# Patient Record
Sex: Female | Born: 1965
Health system: Southern US, Community
[De-identification: ages and names within clinical notes are randomized; demographics above are authoritative.]

## PROBLEM LIST (undated history)

## (undated) DIAGNOSIS — I10 Essential (primary) hypertension: Secondary | ICD-10-CM

## (undated) DIAGNOSIS — R682 Dry mouth, unspecified: Secondary | ICD-10-CM

## (undated) DIAGNOSIS — H04123 Dry eye syndrome of bilateral lacrimal glands: Secondary | ICD-10-CM

## (undated) DIAGNOSIS — G43909 Migraine, unspecified, not intractable, without status migrainosus: Secondary | ICD-10-CM

## (undated) DIAGNOSIS — T7840XA Allergy, unspecified, initial encounter: Secondary | ICD-10-CM

## (undated) DIAGNOSIS — M797 Fibromyalgia: Secondary | ICD-10-CM

## (undated) DIAGNOSIS — K589 Irritable bowel syndrome without diarrhea: Secondary | ICD-10-CM

## (undated) DIAGNOSIS — R5383 Other fatigue: Secondary | ICD-10-CM

## (undated) DIAGNOSIS — M1990 Unspecified osteoarthritis, unspecified site: Secondary | ICD-10-CM

## (undated) DIAGNOSIS — R569 Unspecified convulsions: Secondary | ICD-10-CM

## (undated) HISTORY — PX: KNEE SURGERY: SHX244

## (undated) HISTORY — DX: Migraine, unspecified, not intractable, without status migrainosus: G43.909

## (undated) HISTORY — DX: Unspecified convulsions: R56.9

## (undated) HISTORY — PX: COLONOSCOPY: SHX174

## (undated) HISTORY — DX: Allergy, unspecified, initial encounter: T78.40XA

## (undated) HISTORY — DX: Irritable bowel syndrome, unspecified: K58.9

## (undated) HISTORY — PX: OTHER SURGICAL HISTORY: SHX169

## (undated) HISTORY — DX: Dry mouth, unspecified: R68.2

## (undated) HISTORY — DX: Dry eye syndrome of bilateral lacrimal glands: H04.123

## (undated) HISTORY — DX: Other fatigue: R53.83

## (undated) HISTORY — PX: ABDOMINAL HYSTERECTOMY: SHX81

## (undated) HISTORY — PX: SHOULDER SURGERY: SHX246

---

## 1999-05-03 ENCOUNTER — Emergency Department (HOSPITAL_COMMUNITY): Admission: EM | Admit: 1999-05-03 | Discharge: 1999-05-03 | Payer: Self-pay | Admitting: Emergency Medicine

## 1999-05-03 ENCOUNTER — Encounter: Payer: Self-pay | Admitting: Emergency Medicine

## 1999-07-18 ENCOUNTER — Other Ambulatory Visit: Admission: RE | Admit: 1999-07-18 | Discharge: 1999-07-18 | Payer: Self-pay | Admitting: Obstetrics and Gynecology

## 1999-08-01 ENCOUNTER — Other Ambulatory Visit: Admission: RE | Admit: 1999-08-01 | Discharge: 1999-08-01 | Payer: Self-pay | Admitting: Obstetrics and Gynecology

## 1999-08-01 ENCOUNTER — Encounter (INDEPENDENT_AMBULATORY_CARE_PROVIDER_SITE_OTHER): Payer: Self-pay | Admitting: Specialist

## 2000-11-07 ENCOUNTER — Other Ambulatory Visit: Admission: RE | Admit: 2000-11-07 | Discharge: 2000-11-07 | Payer: Self-pay | Admitting: Obstetrics and Gynecology

## 2001-01-18 ENCOUNTER — Encounter: Admission: RE | Admit: 2001-01-18 | Discharge: 2001-01-18 | Payer: Self-pay | Admitting: Orthopedic Surgery

## 2001-01-18 ENCOUNTER — Encounter: Payer: Self-pay | Admitting: Orthopedic Surgery

## 2001-12-26 ENCOUNTER — Other Ambulatory Visit: Admission: RE | Admit: 2001-12-26 | Discharge: 2001-12-26 | Payer: Self-pay | Admitting: Obstetrics and Gynecology

## 2002-06-09 ENCOUNTER — Encounter: Payer: Self-pay | Admitting: Family Medicine

## 2002-06-09 ENCOUNTER — Encounter: Admission: RE | Admit: 2002-06-09 | Discharge: 2002-06-09 | Payer: Self-pay | Admitting: Family Medicine

## 2003-03-03 ENCOUNTER — Other Ambulatory Visit: Admission: RE | Admit: 2003-03-03 | Discharge: 2003-03-03 | Payer: Self-pay | Admitting: Obstetrics and Gynecology

## 2003-08-10 ENCOUNTER — Encounter: Admission: RE | Admit: 2003-08-10 | Discharge: 2003-08-10 | Payer: Self-pay | Admitting: Family Medicine

## 2003-08-10 ENCOUNTER — Encounter: Payer: Self-pay | Admitting: Family Medicine

## 2004-05-16 ENCOUNTER — Other Ambulatory Visit: Admission: RE | Admit: 2004-05-16 | Discharge: 2004-05-16 | Payer: Self-pay | Admitting: Obstetrics and Gynecology

## 2004-11-18 ENCOUNTER — Encounter: Admission: RE | Admit: 2004-11-18 | Discharge: 2004-11-18 | Payer: Self-pay | Admitting: Family Medicine

## 2005-06-02 ENCOUNTER — Other Ambulatory Visit: Admission: RE | Admit: 2005-06-02 | Discharge: 2005-06-02 | Payer: Self-pay | Admitting: Obstetrics and Gynecology

## 2005-06-23 ENCOUNTER — Encounter: Admission: RE | Admit: 2005-06-23 | Discharge: 2005-06-23 | Payer: Self-pay | Admitting: Gastroenterology

## 2005-12-15 ENCOUNTER — Ambulatory Visit (HOSPITAL_COMMUNITY): Admission: RE | Admit: 2005-12-15 | Discharge: 2005-12-15 | Payer: Self-pay | Admitting: Gastroenterology

## 2005-12-18 ENCOUNTER — Ambulatory Visit (HOSPITAL_COMMUNITY): Admission: RE | Admit: 2005-12-18 | Discharge: 2005-12-18 | Payer: Self-pay | Admitting: Gastroenterology

## 2007-01-24 ENCOUNTER — Encounter: Admission: RE | Admit: 2007-01-24 | Discharge: 2007-01-24 | Payer: Self-pay | Admitting: Internal Medicine

## 2008-04-16 ENCOUNTER — Encounter: Admission: RE | Admit: 2008-04-16 | Discharge: 2008-04-16 | Payer: Self-pay | Admitting: Obstetrics and Gynecology

## 2010-02-04 ENCOUNTER — Encounter: Admission: RE | Admit: 2010-02-04 | Discharge: 2010-02-04 | Payer: Self-pay | Admitting: Orthopedic Surgery

## 2010-04-01 ENCOUNTER — Ambulatory Visit: Admission: RE | Admit: 2010-04-01 | Discharge: 2010-04-01 | Payer: Self-pay | Admitting: Orthopaedic Surgery

## 2010-04-01 ENCOUNTER — Ambulatory Visit: Payer: Self-pay | Admitting: Vascular Surgery

## 2011-10-04 ENCOUNTER — Other Ambulatory Visit: Payer: Self-pay | Admitting: Obstetrics and Gynecology

## 2011-10-04 DIAGNOSIS — R928 Other abnormal and inconclusive findings on diagnostic imaging of breast: Secondary | ICD-10-CM

## 2011-10-10 ENCOUNTER — Ambulatory Visit
Admission: RE | Admit: 2011-10-10 | Discharge: 2011-10-10 | Disposition: A | Discharge status: Home / Self Care | Payer: BC Managed Care – PPO | Source: Ambulatory Visit | Attending: Obstetrics and Gynecology | Admitting: Obstetrics and Gynecology

## 2011-10-10 DIAGNOSIS — R928 Other abnormal and inconclusive findings on diagnostic imaging of breast: Secondary | ICD-10-CM

## 2012-04-18 ENCOUNTER — Other Ambulatory Visit: Payer: Self-pay | Admitting: Orthopedic Surgery

## 2012-04-18 DIAGNOSIS — M25569 Pain in unspecified knee: Secondary | ICD-10-CM

## 2012-04-25 ENCOUNTER — Ambulatory Visit
Admission: RE | Admit: 2012-04-25 | Discharge: 2012-04-25 | Disposition: A | Discharge status: Home / Self Care | Payer: BC Managed Care – PPO | Source: Ambulatory Visit | Attending: Orthopedic Surgery | Admitting: Orthopedic Surgery

## 2012-04-25 DIAGNOSIS — M25569 Pain in unspecified knee: Secondary | ICD-10-CM

## 2012-08-20 ENCOUNTER — Other Ambulatory Visit: Payer: Self-pay | Admitting: Obstetrics and Gynecology

## 2012-08-20 DIAGNOSIS — Z1231 Encounter for screening mammogram for malignant neoplasm of breast: Secondary | ICD-10-CM

## 2012-09-30 ENCOUNTER — Ambulatory Visit: Payer: BC Managed Care – PPO

## 2012-09-30 ENCOUNTER — Ambulatory Visit
Admission: RE | Admit: 2012-09-30 | Discharge: 2012-09-30 | Disposition: A | Discharge status: Home / Self Care | Payer: BC Managed Care – PPO | Source: Ambulatory Visit | Attending: Obstetrics and Gynecology | Admitting: Obstetrics and Gynecology

## 2012-09-30 DIAGNOSIS — Z1231 Encounter for screening mammogram for malignant neoplasm of breast: Secondary | ICD-10-CM

## 2013-11-07 ENCOUNTER — Other Ambulatory Visit: Payer: Self-pay

## 2013-11-07 DIAGNOSIS — Z1231 Encounter for screening mammogram for malignant neoplasm of breast: Secondary | ICD-10-CM

## 2013-11-10 ENCOUNTER — Ambulatory Visit
Admission: RE | Admit: 2013-11-10 | Discharge: 2013-11-10 | Disposition: A | Discharge status: Home / Self Care | Payer: No Typology Code available for payment source | Source: Ambulatory Visit

## 2013-11-10 DIAGNOSIS — Z1231 Encounter for screening mammogram for malignant neoplasm of breast: Secondary | ICD-10-CM

## 2014-11-30 ENCOUNTER — Other Ambulatory Visit: Payer: Self-pay

## 2014-11-30 DIAGNOSIS — Z1231 Encounter for screening mammogram for malignant neoplasm of breast: Secondary | ICD-10-CM

## 2014-12-04 ENCOUNTER — Ambulatory Visit
Admission: RE | Admit: 2014-12-04 | Discharge: 2014-12-04 | Disposition: A | Discharge status: Home / Self Care | Payer: No Typology Code available for payment source | Source: Ambulatory Visit

## 2014-12-04 DIAGNOSIS — Z1231 Encounter for screening mammogram for malignant neoplasm of breast: Secondary | ICD-10-CM

## 2015-07-07 ENCOUNTER — Encounter (HOSPITAL_COMMUNITY): Payer: Self-pay | Admitting: *Deleted

## 2015-07-07 ENCOUNTER — Emergency Department (INDEPENDENT_AMBULATORY_CARE_PROVIDER_SITE_OTHER)
Admission: EM | Admit: 2015-07-07 | Discharge: 2015-07-07 | Disposition: A | Discharge status: Home / Self Care | Payer: Worker's Compensation | Source: Home / Self Care | Attending: Family Medicine | Admitting: Family Medicine

## 2015-07-07 ENCOUNTER — Emergency Department (INDEPENDENT_AMBULATORY_CARE_PROVIDER_SITE_OTHER): Payer: Worker's Compensation

## 2015-07-07 DIAGNOSIS — S93402A Sprain of unspecified ligament of left ankle, initial encounter: Secondary | ICD-10-CM

## 2015-07-07 HISTORY — DX: Essential (primary) hypertension: I10

## 2015-07-07 HISTORY — DX: Fibromyalgia: M79.7

## 2015-07-07 HISTORY — DX: Unspecified osteoarthritis, unspecified site: M19.90

## 2015-07-07 NOTE — ED Notes (Signed)
pT  REPORTS  SHE  WAS  AT  WORK  AND  STEPPED  IN A  HOLE  /  TWISTING  HER L  ANKLE          dENYS  ANY OTHER INJURYS  SHE  IS  ABLE  TO  AMBULATE

## 2015-07-07 NOTE — ED Provider Notes (Signed)
CSN: [id number]     Arrival date & time 07/07/15  1703 History   None    Chief Complaint  Patient presents with  . Ankle Injury   (Consider location/radiation/quality/duration/timing/severity/associated sxs/prior Treatment) Patient is a 49 y.o. female presenting with lower extremity injury. The history is provided by the patient.  Ankle Injury This is a new problem. 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 The problem has not changed since onset.   Past Medical History  Diagnosis Date  . Hypertension   . Fibromyalgia   . Arthritis    Past Surgical History  Procedure Laterality Date  . Abdominal hysterectomy    . Shoulder surgery    . Knee surgery     History reviewed. No pertinent family history. Social History  Substance Use Topics  . Smoking status: Never Smoker   . Smokeless tobacco: None  . Alcohol Use: No   OB History    No data available     Review of Systems  Constitutional: Negative.   Musculoskeletal: Positive for joint swelling. Negative for myalgias and gait problem.  Skin: Negative.   All other systems reviewed and are negative.   Allergies  Codeine and Sulfa antibiotics  Home Medications   Prior to Admission medications   Medication Sig Start Date End Date Taking? Authorizing Provider  Amphetamine-Dextroamphetamine (ADDERALL PO) Take by mouth.   Yes Historical Provider, MD  DULoxetine HCl (CYMBALTA PO) Take by mouth.   Yes Historical Provider, MD  ESTRADIOL ACETATE PO Take by mouth.   Yes Historical Provider, MD  Hydroxychloroquine Sulfate (PLAQUENIL PO) Take by mouth.   Yes Historical Provider, MD  Ibuprofen (IBU PO) Take by mouth.   Yes Historical Provider, MD  losartan-hydrochlorothiazide (HYZAAR) 100-25 MG per tablet Take 1 tablet by mouth daily.   Yes Historical Provider, MD   Meds Ordered and Administered this Visit  Medications - No data to display  BP 135/93 mmHg  Pulse  100  Temp(Src) 99.2 F (37.3 C) (Oral)  Resp 18  SpO2 97% No data found.   Physical Exam  Constitutional: She is oriented to person, place, and time. She appears well-developed and well-nourished.  Musculoskeletal: She exhibits tenderness.       Left ankle: She exhibits swelling. She exhibits no ecchymosis, no deformity and normal pulse. Tenderness. Lateral malleolus tenderness found. No medial malleolus, no CF ligament, no posterior TFL, no head of 5th metatarsal and no proximal fibula tenderness found. Achilles tendon normal.  Neurological: She is alert and oriented to person, place, and time.  Skin: Skin is warm and dry.  Nursing note and vitals reviewed.   ED Course  Procedures (including critical care time)  Labs Review Labs Reviewed - No data to display  Imaging Review Dg Ankle Complete Left  07/07/2015   CLINICAL DATA:  Status post ankle injury today with left ankle pain.  EXAM: LEFT ANKLE COMPLETE - 3+ VIEW  COMPARISON:  None.  FINDINGS: There is no evidence of fracture, dislocation, or joint effusion. There is no evidence of arthropathy or other focal bone abnormality. Soft tissues are unremarkable.  IMPRESSION: Negative.   Electronically Signed   By: Abelardo Diesel M.D.   On: 07/07/2015 18:33   X-rays reviewed and report per radiologist.   Visual Acuity Review  Right Eye Distance:   Left Eye Distance:   Bilateral Distance:    Right Eye Near:   Left Eye  Near:    Bilateral Near:         MDM   1. Ankle sprain, left, initial encounter        Billy Fischer, MD 07/07/15 2102

## 2015-07-07 NOTE — Discharge Instructions (Signed)
Wear ankle support as needed for comfort, activity as tolerated. advil and ice as needed, return or see orthopedist if further problems. °

## 2015-12-29 ENCOUNTER — Other Ambulatory Visit: Payer: Self-pay

## 2015-12-29 DIAGNOSIS — Z1231 Encounter for screening mammogram for malignant neoplasm of breast: Secondary | ICD-10-CM

## 2016-01-27 ENCOUNTER — Ambulatory Visit
Admission: RE | Admit: 2016-01-27 | Discharge: 2016-01-27 | Disposition: A | Discharge status: Home / Self Care | Payer: Managed Care, Other (non HMO) | Source: Ambulatory Visit

## 2016-01-27 DIAGNOSIS — Z1231 Encounter for screening mammogram for malignant neoplasm of breast: Secondary | ICD-10-CM

## 2016-11-08 DIAGNOSIS — J0100 Acute maxillary sinusitis, unspecified: Secondary | ICD-10-CM | POA: Diagnosis not present

## 2016-11-08 DIAGNOSIS — R05 Cough: Secondary | ICD-10-CM | POA: Diagnosis not present

## 2016-11-08 MED FILL — PROMETHAZINE-DM SYRUP: 6.25-15 | 3 days supply | Qty: 120 | Fill #0

## 2016-11-08 MED FILL — AMOX-CLAV 875-125 MG TABLET: 875-125 | 10 days supply | Qty: 20 | Fill #0

## 2016-11-24 DIAGNOSIS — F900 Attention-deficit hyperactivity disorder, predominantly inattentive type: Secondary | ICD-10-CM | POA: Diagnosis not present

## 2016-11-24 DIAGNOSIS — M797 Fibromyalgia: Secondary | ICD-10-CM | POA: Diagnosis not present

## 2016-11-24 DIAGNOSIS — J0101 Acute recurrent maxillary sinusitis: Secondary | ICD-10-CM | POA: Diagnosis not present

## 2016-11-24 DIAGNOSIS — I10 Essential (primary) hypertension: Secondary | ICD-10-CM | POA: Diagnosis not present

## 2016-11-24 MED FILL — AZITHROMYCIN 250 MG TABLET: 250 | 5 days supply | Qty: 6 | Fill #0

## 2016-11-24 MED FILL — AMPHETAMINE SALTS 30 MG TAB: 30 | 30 days supply | Qty: 60 | Fill #0

## 2016-11-24 MED FILL — LOSARTAN-HCTZ 100-25 MG TAB: 100-25 | 90 days supply | Qty: 90 | Fill #0

## 2016-11-30 MED FILL — HYDROXYCHLOROQUINE 200 MG T: 200 | 30 days supply | Qty: 60 | Fill #0

## 2016-11-30 MED FILL — VENLAFAXINE HCL ER 75 MG CA: 75 | 90 days supply | Qty: 90 | Fill #0

## 2016-11-30 MED FILL — ESTRADIOL 2 MG TABLET: 2 | 90 days supply | Qty: 90 | Fill #0

## 2016-12-19 MED FILL — levoFLOXacin 500 MG TABS: 500 | 10 days supply | Qty: 10 | Fill #0

## 2017-01-03 MED FILL — HYDROXYCHLOROQUINE 200 MG T: 200 | 30 days supply | Qty: 60 | Fill #1

## 2017-01-05 ENCOUNTER — Other Ambulatory Visit: Payer: Self-pay | Admitting: Obstetrics and Gynecology

## 2017-01-05 DIAGNOSIS — Z1231 Encounter for screening mammogram for malignant neoplasm of breast: Secondary | ICD-10-CM

## 2017-01-17 DIAGNOSIS — Z01419 Encounter for gynecological examination (general) (routine) without abnormal findings: Secondary | ICD-10-CM | POA: Diagnosis not present

## 2017-01-17 DIAGNOSIS — Z6829 Body mass index (BMI) 29.0-29.9, adult: Secondary | ICD-10-CM | POA: Diagnosis not present

## 2017-02-02 ENCOUNTER — Ambulatory Visit: Payer: Self-pay

## 2017-02-06 MED FILL — HYDROXYCHLOROQUINE 200 MG T: 200 | 30 days supply | Qty: 60 | Fill #2

## 2017-02-09 ENCOUNTER — Ambulatory Visit
Admission: RE | Admit: 2017-02-09 | Discharge: 2017-02-09 | Disposition: A | Discharge status: Home / Self Care | Payer: 59 | Source: Ambulatory Visit | Attending: Obstetrics and Gynecology | Admitting: Obstetrics and Gynecology

## 2017-02-09 DIAGNOSIS — Z1231 Encounter for screening mammogram for malignant neoplasm of breast: Secondary | ICD-10-CM | POA: Diagnosis not present

## 2017-02-22 DIAGNOSIS — M79642 Pain in left hand: Secondary | ICD-10-CM | POA: Diagnosis not present

## 2017-02-22 DIAGNOSIS — M25522 Pain in left elbow: Secondary | ICD-10-CM | POA: Diagnosis not present

## 2017-02-23 DIAGNOSIS — Z79899 Other long term (current) drug therapy: Secondary | ICD-10-CM | POA: Diagnosis not present

## 2017-02-28 MED FILL — LOSARTAN-HCTZ 100-25 MG TAB: 100-25 | 90 days supply | Qty: 90 | Fill #1

## 2017-02-28 MED FILL — VENLAFAXINE HCL ER 75 MG CA: 75 | 90 days supply | Qty: 90 | Fill #0

## 2017-02-28 MED FILL — ESTRADIOL 2 MG TABLET: 2 | 90 days supply | Qty: 90 | Fill #0

## 2017-03-01 MED FILL — IBUPROFEN 800 MG TAB: 800 | 90 days supply | Qty: 270 | Fill #0

## 2017-03-01 MED FILL — DULoxetine HCL 60 MG CPEP: 60 | 90 days supply | Qty: 90 | Fill #0

## 2017-03-01 MED FILL — AMPHETAMINE SALTS 30 MG TAB: 30 | 30 days supply | Qty: 60 | Fill #0

## 2017-03-02 DIAGNOSIS — H524 Presbyopia: Secondary | ICD-10-CM | POA: Diagnosis not present

## 2017-03-02 DIAGNOSIS — H40013 Open angle with borderline findings, low risk, bilateral: Secondary | ICD-10-CM | POA: Diagnosis not present

## 2017-03-02 DIAGNOSIS — Z79899 Other long term (current) drug therapy: Secondary | ICD-10-CM | POA: Diagnosis not present

## 2017-03-02 DIAGNOSIS — H5203 Hypermetropia, bilateral: Secondary | ICD-10-CM | POA: Diagnosis not present

## 2017-03-06 MED FILL — HYDROXYCHLOROQUINE 200 MG T: 200 | 30 days supply | Qty: 60 | Fill #0

## 2017-04-03 MED FILL — HYDROXYCHLOROQUINE 200 MG T: 200 | 30 days supply | Qty: 60 | Fill #1

## 2017-04-19 DIAGNOSIS — M25539 Pain in unspecified wrist: Secondary | ICD-10-CM | POA: Diagnosis not present

## 2017-04-19 DIAGNOSIS — M150 Primary generalized (osteo)arthritis: Secondary | ICD-10-CM | POA: Diagnosis not present

## 2017-04-19 DIAGNOSIS — Z79899 Other long term (current) drug therapy: Secondary | ICD-10-CM | POA: Diagnosis not present

## 2017-04-19 DIAGNOSIS — M797 Fibromyalgia: Secondary | ICD-10-CM | POA: Diagnosis not present

## 2017-04-23 MED FILL — FOLIC ACID 1 MG TABLET: 1 | 30 days supply | Qty: 30 | Fill #0

## 2017-04-23 MED FILL — METHOTREXATE 2.5 MG TABLET: 2.5 | 28 days supply | Qty: 16 | Fill #0

## 2017-05-01 MED FILL — HYDROXYCHLOROQUINE 200 MG T: 200 | 30 days supply | Qty: 60 | Fill #2

## 2017-05-11 DIAGNOSIS — M797 Fibromyalgia: Secondary | ICD-10-CM | POA: Diagnosis not present

## 2017-05-11 DIAGNOSIS — M064 Inflammatory polyarthropathy: Secondary | ICD-10-CM | POA: Diagnosis not present

## 2017-05-11 DIAGNOSIS — M150 Primary generalized (osteo)arthritis: Secondary | ICD-10-CM | POA: Diagnosis not present

## 2017-05-11 DIAGNOSIS — Z79899 Other long term (current) drug therapy: Secondary | ICD-10-CM | POA: Diagnosis not present

## 2017-05-25 DIAGNOSIS — F900 Attention-deficit hyperactivity disorder, predominantly inattentive type: Secondary | ICD-10-CM | POA: Diagnosis not present

## 2017-05-25 DIAGNOSIS — I10 Essential (primary) hypertension: Secondary | ICD-10-CM | POA: Diagnosis not present

## 2017-05-25 DIAGNOSIS — M797 Fibromyalgia: Secondary | ICD-10-CM | POA: Diagnosis not present

## 2017-05-25 DIAGNOSIS — R5383 Other fatigue: Secondary | ICD-10-CM | POA: Diagnosis not present

## 2017-05-25 MED FILL — METHOTREXATE 2.5 MG TABLET: 2.5 | 28 days supply | Qty: 24 | Fill #0

## 2017-05-25 MED FILL — AMPHETAMINE SALTS 30 MG TAB: 30 | 30 days supply | Qty: 60 | Fill #0

## 2017-05-25 MED FILL — FOLIC ACID 1 MG TABLET: 1 | 30 days supply | Qty: 30 | Fill #1

## 2017-05-25 MED FILL — LOSARTAN-HCTZ 100-25 MG TAB: 100-25 | 90 days supply | Qty: 90 | Fill #0

## 2017-05-25 MED FILL — HYDROXYCHLOROQUINE 200 MG T: 200 | 90 days supply | Qty: 180 | Fill #0

## 2017-05-28 MED FILL — DULoxetine HCL 60 MG CPEP: 60 | 90 days supply | Qty: 90 | Fill #0

## 2017-05-28 MED FILL — VENLAFAXINE HCL ER 75 MG CA: 75 | 90 days supply | Qty: 90 | Fill #1

## 2017-06-02 DIAGNOSIS — S06339A Contusion and laceration of cerebrum, unspecified, with loss of consciousness of unspecified duration, initial encounter: Secondary | ICD-10-CM | POA: Diagnosis not present

## 2017-06-05 MED FILL — ESTRADIOL 2 MG TABLET: 2 | 90 days supply | Qty: 90 | Fill #1

## 2017-06-28 DIAGNOSIS — M25561 Pain in right knee: Secondary | ICD-10-CM | POA: Diagnosis not present

## 2017-06-28 DIAGNOSIS — M25562 Pain in left knee: Secondary | ICD-10-CM | POA: Diagnosis not present

## 2017-07-05 MED FILL — METHOTREXATE 2.5 MG TABLET: 2.5 | 28 days supply | Qty: 24 | Fill #1

## 2017-07-05 MED FILL — FOLIC ACID 1 MG TABLET: 1 | 30 days supply | Qty: 30 | Fill #2

## 2017-07-06 MED FILL — AMPHETAMINE SALTS 30 MG TAB: 30 | 30 days supply | Qty: 60 | Fill #0

## 2017-07-12 DIAGNOSIS — Z79899 Other long term (current) drug therapy: Secondary | ICD-10-CM | POA: Diagnosis not present

## 2017-07-12 DIAGNOSIS — M150 Primary generalized (osteo)arthritis: Secondary | ICD-10-CM | POA: Diagnosis not present

## 2017-07-12 DIAGNOSIS — M3500 Sicca syndrome, unspecified: Secondary | ICD-10-CM | POA: Diagnosis not present

## 2017-07-12 DIAGNOSIS — M064 Inflammatory polyarthropathy: Secondary | ICD-10-CM | POA: Diagnosis not present

## 2017-07-13 DIAGNOSIS — D229 Melanocytic nevi, unspecified: Secondary | ICD-10-CM | POA: Diagnosis not present

## 2017-07-13 DIAGNOSIS — L4050 Arthropathic psoriasis, unspecified: Secondary | ICD-10-CM | POA: Diagnosis not present

## 2017-07-13 DIAGNOSIS — L409 Psoriasis, unspecified: Secondary | ICD-10-CM | POA: Diagnosis not present

## 2017-08-21 MED FILL — DULoxetine HCL 60 MG CPEP: 60 | 90 days supply | Qty: 90 | Fill #1

## 2017-08-21 MED FILL — HYDROXYCHLOROQUINE 200 MG T: 200 | 30 days supply | Qty: 60 | Fill #1

## 2017-08-21 MED FILL — VENLAFAXINE HCL ER 75 MG CA: 75 | 90 days supply | Qty: 90 | Fill #2

## 2017-08-21 MED FILL — LOSARTAN-HCTZ 100-25 MG TAB: 100-25 | 90 days supply | Qty: 90 | Fill #1

## 2017-08-21 MED FILL — IBUPROFEN 800 MG TABS: 800 | 90 days supply | Qty: 270 | Fill #0

## 2017-08-21 MED FILL — ESTRADIOL 2 MG TABLET: 2 | 90 days supply | Qty: 90 | Fill #2

## 2017-08-22 MED FILL — AMPHETAMINE SALTS 30 MG TAB: 30 | 30 days supply | Qty: 60 | Fill #0

## 2017-09-14 DIAGNOSIS — J0100 Acute maxillary sinusitis, unspecified: Secondary | ICD-10-CM | POA: Diagnosis not present

## 2017-09-25 MED FILL — HYDROXYCHLOROQUINE 200 MG T: 200 | 90 days supply | Qty: 180 | Fill #0

## 2017-09-26 MED FILL — METHYLPREDNISOLONE 4 MG TAB: 4 | 6 days supply | Qty: 21 | Fill #0

## 2017-10-15 DIAGNOSIS — G4489 Other headache syndrome: Secondary | ICD-10-CM | POA: Diagnosis not present

## 2017-10-16 ENCOUNTER — Other Ambulatory Visit (HOSPITAL_COMMUNITY): Payer: Self-pay | Admitting: Family Medicine

## 2017-10-16 DIAGNOSIS — G4489 Other headache syndrome: Secondary | ICD-10-CM

## 2017-10-19 ENCOUNTER — Encounter (HOSPITAL_COMMUNITY): Payer: Self-pay | Admitting: Radiology

## 2017-10-19 ENCOUNTER — Ambulatory Visit (HOSPITAL_COMMUNITY)
Admission: RE | Admit: 2017-10-19 | Discharge: 2017-10-19 | Disposition: A | Discharge status: Home / Self Care | Payer: 59 | Source: Ambulatory Visit | Attending: Family Medicine | Admitting: Family Medicine

## 2017-10-19 DIAGNOSIS — R51 Headache: Secondary | ICD-10-CM | POA: Diagnosis not present

## 2017-10-19 DIAGNOSIS — G4489 Other headache syndrome: Secondary | ICD-10-CM | POA: Diagnosis not present

## 2017-11-16 DIAGNOSIS — Z79899 Other long term (current) drug therapy: Secondary | ICD-10-CM | POA: Diagnosis not present

## 2017-11-16 DIAGNOSIS — M797 Fibromyalgia: Secondary | ICD-10-CM | POA: Diagnosis not present

## 2017-11-16 DIAGNOSIS — M3500 Sicca syndrome, unspecified: Secondary | ICD-10-CM | POA: Diagnosis not present

## 2017-11-16 DIAGNOSIS — M064 Inflammatory polyarthropathy: Secondary | ICD-10-CM | POA: Diagnosis not present

## 2017-11-16 DIAGNOSIS — M150 Primary generalized (osteo)arthritis: Secondary | ICD-10-CM | POA: Diagnosis not present

## 2017-11-16 DIAGNOSIS — M2550 Pain in unspecified joint: Secondary | ICD-10-CM | POA: Diagnosis not present

## 2017-11-16 MED FILL — CELECOXIB 200 MG CAP: 200 | 30 days supply | Qty: 30 | Fill #0

## 2017-11-16 MED FILL — METHYLPREDNISOLONE 4 MG TAB: 4 | 30 days supply | Qty: 30 | Fill #0

## 2017-11-16 MED FILL — LEFLUNOMIDE 10 MG TABLET: 10 | 30 days supply | Qty: 30 | Fill #0

## 2017-11-23 DIAGNOSIS — K219 Gastro-esophageal reflux disease without esophagitis: Secondary | ICD-10-CM | POA: Diagnosis not present

## 2017-11-23 DIAGNOSIS — I10 Essential (primary) hypertension: Secondary | ICD-10-CM | POA: Diagnosis not present

## 2017-11-23 DIAGNOSIS — F900 Attention-deficit hyperactivity disorder, predominantly inattentive type: Secondary | ICD-10-CM | POA: Diagnosis not present

## 2017-11-23 DIAGNOSIS — M797 Fibromyalgia: Secondary | ICD-10-CM | POA: Diagnosis not present

## 2017-11-23 DIAGNOSIS — Z1211 Encounter for screening for malignant neoplasm of colon: Secondary | ICD-10-CM | POA: Diagnosis not present

## 2017-11-23 DIAGNOSIS — K589 Irritable bowel syndrome without diarrhea: Secondary | ICD-10-CM | POA: Diagnosis not present

## 2017-11-26 MED FILL — VENLAFAXINE HCL ER 75 MG CA: 75 | 90 days supply | Qty: 90 | Fill #3

## 2017-11-26 MED FILL — ESTRADIOL 2 MG TABLET: 2 | 90 days supply | Qty: 90 | Fill #3

## 2017-11-27 MED FILL — AMPHETAMINE SALTS 30 MG TAB: 30 | 30 days supply | Qty: 60 | Fill #0

## 2017-11-27 MED FILL — LOSARTAN-HCTZ 100-25 MG TAB: 100-25 | 90 days supply | Qty: 90 | Fill #0

## 2017-11-30 MED FILL — DULoxetine HCL 60 MG CPEP: 60 | 90 days supply | Qty: 90 | Fill #0

## 2017-12-05 ENCOUNTER — Encounter: Payer: Self-pay | Admitting: Gastroenterology

## 2017-12-14 DIAGNOSIS — M797 Fibromyalgia: Secondary | ICD-10-CM | POA: Diagnosis not present

## 2017-12-14 DIAGNOSIS — M150 Primary generalized (osteo)arthritis: Secondary | ICD-10-CM | POA: Diagnosis not present

## 2017-12-14 DIAGNOSIS — M3500 Sicca syndrome, unspecified: Secondary | ICD-10-CM | POA: Diagnosis not present

## 2017-12-14 DIAGNOSIS — M2550 Pain in unspecified joint: Secondary | ICD-10-CM | POA: Diagnosis not present

## 2017-12-14 DIAGNOSIS — M064 Inflammatory polyarthropathy: Secondary | ICD-10-CM | POA: Diagnosis not present

## 2017-12-14 DIAGNOSIS — Z79899 Other long term (current) drug therapy: Secondary | ICD-10-CM | POA: Diagnosis not present

## 2017-12-14 MED FILL — METHYLPREDNISOLONE 4 MG TAB: 4 | 30 days supply | Qty: 30 | Fill #0

## 2017-12-14 MED FILL — LEFLUNOMIDE 20 MG TABLET: 20 | 30 days supply | Qty: 30 | Fill #0

## 2017-12-31 MED FILL — HYDROXYCHLOROQUINE 200 MG: 200 | 90 days supply | Qty: 180 | Fill #1

## 2018-01-02 MED FILL — AMPHETAMINE SALTS 30 MG TAB: 30 | 30 days supply | Qty: 60 | Fill #0

## 2018-01-04 DIAGNOSIS — R21 Rash and other nonspecific skin eruption: Secondary | ICD-10-CM | POA: Diagnosis not present

## 2018-01-10 ENCOUNTER — Other Ambulatory Visit: Payer: Self-pay | Admitting: Obstetrics and Gynecology

## 2018-01-10 DIAGNOSIS — Z139 Encounter for screening, unspecified: Secondary | ICD-10-CM

## 2018-01-16 MED FILL — LEFLUNOMIDE 20 MG TABLET: 20 | 30 days supply | Qty: 30 | Fill #1

## 2018-01-22 DIAGNOSIS — L509 Urticaria, unspecified: Secondary | ICD-10-CM | POA: Diagnosis not present

## 2018-01-22 DIAGNOSIS — L409 Psoriasis, unspecified: Secondary | ICD-10-CM | POA: Diagnosis not present

## 2018-02-01 ENCOUNTER — Encounter: Payer: Self-pay | Admitting: Gastroenterology

## 2018-02-01 ENCOUNTER — Other Ambulatory Visit: Payer: Self-pay

## 2018-02-01 ENCOUNTER — Ambulatory Visit (AMBULATORY_SURGERY_CENTER): Payer: Self-pay

## 2018-02-01 VITALS — Ht 61.75 in | Wt 153.2 lb

## 2018-02-01 DIAGNOSIS — Z1211 Encounter for screening for malignant neoplasm of colon: Secondary | ICD-10-CM

## 2018-02-01 DIAGNOSIS — Z79899 Other long term (current) drug therapy: Secondary | ICD-10-CM | POA: Diagnosis not present

## 2018-02-01 MED ORDER — NA SULFATE-K SULFATE-MG SULF 17.5-3.13-1.6 GM/177ML PO SOLN
1.0000 | Freq: Once | ORAL | 0 refills | Status: AC
Start: 2018-02-01 — End: 2018-02-01

## 2018-02-01 NOTE — Progress Notes (Signed)
No egg or soy allergy known to patient  No issues with past sedation with any surgeries  or procedures, no intubation problems , PONV No diet pills per patient No home 02 use per patient  No blood thinners per patient  Pt denies issues with constipation  No A fib or A flutter  EMMI video sent to pt's e mail

## 2018-02-14 ENCOUNTER — Other Ambulatory Visit: Payer: Self-pay

## 2018-02-14 ENCOUNTER — Ambulatory Visit (AMBULATORY_SURGERY_CENTER): Payer: 59 | Admitting: Gastroenterology

## 2018-02-14 ENCOUNTER — Encounter: Payer: Self-pay | Admitting: Gastroenterology

## 2018-02-14 VITALS — BP 125/69 | HR 86 | Temp 98.2°F | Resp 17 | Ht 61.75 in | Wt 153.0 lb

## 2018-02-14 DIAGNOSIS — Z1211 Encounter for screening for malignant neoplasm of colon: Secondary | ICD-10-CM | POA: Diagnosis present

## 2018-02-14 DIAGNOSIS — D123 Benign neoplasm of transverse colon: Secondary | ICD-10-CM | POA: Diagnosis not present

## 2018-02-14 MED ORDER — SODIUM CHLORIDE 0.9 % IV SOLN
500.0000 mL | Freq: Once | INTRAVENOUS | Status: DC
Start: 2018-02-14 — End: 2023-08-20

## 2018-02-14 NOTE — Patient Instructions (Signed)
INFORMATION ON POLYPS GIVEN.   YOU HAD AN ENDOSCOPIC PROCEDURE TODAY AT THE [facility]:   Refer to the procedure report that was given to you for any specific questions about what was found during the examination.  If the procedure report does not answer your questions, please call your gastroenterologist to clarify.  If you requested that your care partner not be given the details of your procedure findings, then the procedure report has been included in a sealed envelope for you to review at your convenience later.  YOU SHOULD EXPECT: Some feelings of bloating in the abdomen. Passage of more gas than usual.  Walking can help get rid of the air that was put into your GI tract during the procedure and reduce the bloating. If you had a lower endoscopy (such as a colonoscopy or flexible sigmoidoscopy) you may notice spotting of blood in your stool or on the toilet paper. If you underwent a bowel prep for your procedure, you may not have a normal bowel movement for a few days.  Please Note:  You might notice some irritation and congestion in your nose or some drainage.  This is from the oxygen used during your procedure.  There is no need for concern and it should clear up in a day or so.  SYMPTOMS TO REPORT IMMEDIATELY:   Following lower endoscopy (colonoscopy or flexible sigmoidoscopy):  Excessive amounts of blood in the stool  Significant tenderness or worsening of abdominal pains  Swelling of the abdomen that is new, acute  Fever of 100F or higher    For urgent or emergent issues, a gastroenterologist can be reached at any hour by calling [phone].   DIET:  We do recommend a small meal at first, but then you may proceed to your regular diet.  Drink plenty of fluids but you should avoid alcoholic beverages for 24 hours.  ACTIVITY:  You should plan to take it easy for the rest of today and you should NOT DRIVE or use heavy machinery until tomorrow (because of the sedation  medicines used during the test).    FOLLOW UP: Our staff will call the number listed on your records the next business day following your procedure to check on you and address any questions or concerns that you may have regarding the information given to you following your procedure. If we do not reach you, we will leave a message.  However, if you are feeling well and you are not experiencing any problems, there is no need to return our call.  We will assume that you have returned to your regular daily activities without incident.  If any biopsies were taken you will be contacted by phone or by letter within the next 1-3 weeks.  Please call us at [phone] if you have not heard about the biopsies in 3 weeks.    SIGNATURES/CONFIDENTIALITY: You and/or your care partner have signed paperwork which will be entered into your electronic medical record.  These signatures attest to the fact that that the information above on your After Visit Summary has been reviewed and is understood.  Full responsibility of the confidentiality of this discharge information lies with you and/or your care-partner. 

## 2018-02-14 NOTE — Op Note (Signed)
[facility] Patient Name: Megan Tanner Procedure Date: 02/14/2018 11:07 AM MRN: [id number] Endoscopist: Mauri Pole , MD Age: 52 Referring MD:  Date of Birth: 1966-01-11 Gender: Female Account #: [id number] Procedure:                Colonoscopy Indications:              Screening for colorectal malignant neoplasm Medicines:                Monitored Anesthesia Care Procedure:                Pre-Anesthesia Assessment:                           - Prior to the procedure, a History and Physical                            was performed, and patient medications and                            allergies were reviewed. The patient's tolerance of                            previous anesthesia was also reviewed. The risks                            and benefits of the procedure and the sedation                            options and risks were discussed with the patient.                            All questions were answered, and informed consent                            was obtained. Prior Anticoagulants: The patient has                            taken no previous anticoagulant or antiplatelet                            agents. ASA Grade Assessment: II - A patient with                            mild systemic disease. After reviewing the risks                            and benefits, the patient was deemed in                            satisfactory condition to undergo the procedure.                           After obtaining informed consent, the colonoscope  was passed under direct vision. Throughout the                            procedure, the patient's blood pressure, pulse, and                            oxygen saturations were monitored continuously. The                            Model PCF-H190DL [id number]) scope was introduced                            through the anus and advanced to the the cecum,                            identified  by appendiceal orifice and ileocecal                            valve. The colonoscopy was performed without                            difficulty. The patient tolerated the procedure                            well. The quality of the bowel preparation was                            adequate. The ileocecal valve, appendiceal orifice,                            and rectum were photographed. Scope In: 11:14:28 AM Scope Out: 11:38:31 AM Scope Withdrawal Time: 0 hours 15 minutes 54 seconds  Total Procedure Duration: 0 hours 24 minutes 3 seconds  Findings:                 The perianal and digital rectal examinations were                            normal.                           A 4 mm polyp was found in the transverse colon. The                            polyp was sessile. The polyp was removed with a                            cold snare. Resection and retrieval were complete.                           A 3 mm polyp was found in the transverse colon. The                            polyp was sessile. The polyp was removed  with a                            cold biopsy forceps. Resection and retrieval were                            complete.                           Non-bleeding internal hemorrhoids were found during                            retroflexion. The hemorrhoids were small. Complications:            No immediate complications. Estimated Blood Loss:     Estimated blood loss was minimal. Impression:               - One 4 mm polyp in the transverse colon, removed                            with a cold snare. Resected and retrieved.                           - One 3 mm polyp in the transverse colon, removed                            with a cold biopsy forceps. Resected and retrieved.                           - Non-bleeding internal hemorrhoids. Recommendation:           - Patient has a contact number available for                            emergencies. The signs and symptoms of  potential                            delayed complications were discussed with the                            patient. Return to normal activities tomorrow.                            Written discharge instructions were provided to the                            patient.                           - Resume previous diet.                           - Continue present medications.                           - Await pathology results.                           -  Repeat colonoscopy in 5-10 years for surveillance                            based on pathology results. Mauri Pole, MD 02/14/2018 11:48:16 AM This report has been signed electronically.

## 2018-02-14 NOTE — Progress Notes (Signed)
Pt's states no medical or surgical changes since previsit or office visit. 

## 2018-02-14 NOTE — Progress Notes (Signed)
A and O x3. Report to RN. Tolerated MAC anesthesia well.

## 2018-02-14 NOTE — Progress Notes (Signed)
Called to room to assist during endoscopic procedure.  Patient ID and intended procedure confirmed with present staff. Received instructions for my participation in the procedure from the performing physician.  

## 2018-02-18 ENCOUNTER — Ambulatory Visit
Admission: RE | Admit: 2018-02-18 | Discharge: 2018-02-18 | Disposition: A | Discharge status: Home / Self Care | Payer: 59 | Source: Ambulatory Visit | Attending: Obstetrics and Gynecology | Admitting: Obstetrics and Gynecology

## 2018-02-18 ENCOUNTER — Telehealth: Payer: Self-pay | Admitting: *Deleted

## 2018-02-18 ENCOUNTER — Telehealth: Payer: Self-pay

## 2018-02-18 DIAGNOSIS — Z139 Encounter for screening, unspecified: Secondary | ICD-10-CM

## 2018-02-18 DIAGNOSIS — Z1231 Encounter for screening mammogram for malignant neoplasm of breast: Secondary | ICD-10-CM | POA: Diagnosis not present

## 2018-02-18 NOTE — Telephone Encounter (Signed)
Patient returned phone call stating that she is not having any problems and is feeling fine since last weeks procedure.

## 2018-02-18 NOTE — Telephone Encounter (Signed)
Called [phone] and left a messaged we tried to reach pt for a follow up call. maw

## 2018-02-18 NOTE — Telephone Encounter (Signed)
No answer. Name identifier. Message left to call if questions or concerns. 

## 2018-02-20 ENCOUNTER — Encounter: Payer: Self-pay | Admitting: Gastroenterology

## 2018-02-21 ENCOUNTER — Encounter: Payer: Self-pay | Admitting: Gastroenterology

## 2018-02-21 DIAGNOSIS — Z6829 Body mass index (BMI) 29.0-29.9, adult: Secondary | ICD-10-CM | POA: Diagnosis not present

## 2018-02-21 DIAGNOSIS — Z01419 Encounter for gynecological examination (general) (routine) without abnormal findings: Secondary | ICD-10-CM | POA: Diagnosis not present

## 2018-02-22 MED FILL — AMPHETAMINE SALTS 30 MG TAB: 30 | 30 days supply | Qty: 60 | Fill #0

## 2018-03-01 ENCOUNTER — Encounter: Payer: Self-pay | Admitting: Gastroenterology

## 2018-03-05 MED FILL — LOSARTAN-HCTZ 100-25 MG TAB: 100-25 | 90 days supply | Qty: 90 | Fill #1

## 2018-03-05 MED FILL — DULoxetine HCL 60 MG CPEP: 60 | 90 days supply | Qty: 90 | Fill #1

## 2018-03-05 MED FILL — VENLAFAXINE HCL ER 75 MG CA: 75 | 90 days supply | Qty: 90 | Fill #0

## 2018-03-05 MED FILL — ESTRADIOL 2 MG TABLET: 2 | 90 days supply | Qty: 90 | Fill #0

## 2018-03-15 DIAGNOSIS — H5203 Hypermetropia, bilateral: Secondary | ICD-10-CM | POA: Diagnosis not present

## 2018-03-15 DIAGNOSIS — H524 Presbyopia: Secondary | ICD-10-CM | POA: Diagnosis not present

## 2018-03-15 DIAGNOSIS — Z79899 Other long term (current) drug therapy: Secondary | ICD-10-CM | POA: Diagnosis not present

## 2018-03-15 DIAGNOSIS — H40013 Open angle with borderline findings, low risk, bilateral: Secondary | ICD-10-CM | POA: Diagnosis not present

## 2018-03-15 DIAGNOSIS — M069 Rheumatoid arthritis, unspecified: Secondary | ICD-10-CM | POA: Diagnosis not present

## 2018-03-18 DIAGNOSIS — M2550 Pain in unspecified joint: Secondary | ICD-10-CM | POA: Diagnosis not present

## 2018-03-18 DIAGNOSIS — M19042 Primary osteoarthritis, left hand: Secondary | ICD-10-CM | POA: Diagnosis not present

## 2018-03-18 DIAGNOSIS — M3500 Sicca syndrome, unspecified: Secondary | ICD-10-CM | POA: Diagnosis not present

## 2018-03-18 DIAGNOSIS — M150 Primary generalized (osteo)arthritis: Secondary | ICD-10-CM | POA: Diagnosis not present

## 2018-03-18 DIAGNOSIS — M19041 Primary osteoarthritis, right hand: Secondary | ICD-10-CM | POA: Diagnosis not present

## 2018-03-18 DIAGNOSIS — M79643 Pain in unspecified hand: Secondary | ICD-10-CM | POA: Diagnosis not present

## 2018-03-18 DIAGNOSIS — Z79899 Other long term (current) drug therapy: Secondary | ICD-10-CM | POA: Diagnosis not present

## 2018-03-18 DIAGNOSIS — M064 Inflammatory polyarthropathy: Secondary | ICD-10-CM | POA: Diagnosis not present

## 2018-03-18 DIAGNOSIS — M79642 Pain in left hand: Secondary | ICD-10-CM | POA: Diagnosis not present

## 2018-03-18 DIAGNOSIS — M79641 Pain in right hand: Secondary | ICD-10-CM | POA: Diagnosis not present

## 2018-03-18 DIAGNOSIS — M797 Fibromyalgia: Secondary | ICD-10-CM | POA: Diagnosis not present

## 2018-04-18 MED FILL — AMPHETAMINE SALTS 30 MG TAB: 30 | 30 days supply | Qty: 60 | Fill #0

## 2018-04-19 MED FILL — HYDROXYCHLOROQUINE SULFATE: 200 | 90 days supply | Qty: 180 | Fill #2

## 2018-04-25 DIAGNOSIS — M25561 Pain in right knee: Secondary | ICD-10-CM | POA: Diagnosis not present

## 2018-04-25 DIAGNOSIS — M25562 Pain in left knee: Secondary | ICD-10-CM | POA: Diagnosis not present

## 2018-04-30 ENCOUNTER — Ambulatory Visit: Payer: Self-pay | Admitting: Sports Medicine

## 2018-05-01 DIAGNOSIS — L603 Nail dystrophy: Secondary | ICD-10-CM | POA: Diagnosis not present

## 2018-05-09 ENCOUNTER — Ambulatory Visit: Payer: Self-pay | Admitting: Sports Medicine

## 2018-05-24 DIAGNOSIS — F900 Attention-deficit hyperactivity disorder, predominantly inattentive type: Secondary | ICD-10-CM | POA: Diagnosis not present

## 2018-05-24 DIAGNOSIS — K589 Irritable bowel syndrome without diarrhea: Secondary | ICD-10-CM | POA: Diagnosis not present

## 2018-05-24 DIAGNOSIS — I10 Essential (primary) hypertension: Secondary | ICD-10-CM | POA: Diagnosis not present

## 2018-05-24 DIAGNOSIS — M797 Fibromyalgia: Secondary | ICD-10-CM | POA: Diagnosis not present

## 2018-05-24 DIAGNOSIS — K219 Gastro-esophageal reflux disease without esophagitis: Secondary | ICD-10-CM | POA: Diagnosis not present

## 2018-05-24 MED FILL — DULoxetine HCL 60 MG CPEP: 60 | 90 days supply | Qty: 180 | Fill #0

## 2018-05-27 MED FILL — VENLAFAXINE HCL ER 75 MG CA: 75 | 90 days supply | Qty: 90 | Fill #1

## 2018-05-27 MED FILL — ESTRADIOL 2 MG TABLET: 2 | 90 days supply | Qty: 90 | Fill #1

## 2018-05-28 MED FILL — PROMETHAZINE 25 MG TABLET: 25 | 8 days supply | Qty: 30 | Fill #0

## 2018-05-28 MED FILL — IBUPROFEN 800 MG TAB: 800 | 90 days supply | Qty: 270 | Fill #0

## 2018-05-28 MED FILL — AMPHETAMINE SALTS 30 MG TAB: 30 | 30 days supply | Qty: 60 | Fill #0

## 2018-05-28 MED FILL — LOSARTAN-HCTZ 100-25 MG TAB: 100-25 | 90 days supply | Qty: 90 | Fill #0

## 2018-06-20 DIAGNOSIS — Z79899 Other long term (current) drug therapy: Secondary | ICD-10-CM | POA: Diagnosis not present

## 2018-06-20 DIAGNOSIS — M2550 Pain in unspecified joint: Secondary | ICD-10-CM | POA: Diagnosis not present

## 2018-06-20 DIAGNOSIS — M3500 Sicca syndrome, unspecified: Secondary | ICD-10-CM | POA: Diagnosis not present

## 2018-06-20 DIAGNOSIS — M797 Fibromyalgia: Secondary | ICD-10-CM | POA: Diagnosis not present

## 2018-06-20 DIAGNOSIS — M064 Inflammatory polyarthropathy: Secondary | ICD-10-CM | POA: Diagnosis not present

## 2018-06-20 DIAGNOSIS — M150 Primary generalized (osteo)arthritis: Secondary | ICD-10-CM | POA: Diagnosis not present

## 2018-06-20 DIAGNOSIS — M79643 Pain in unspecified hand: Secondary | ICD-10-CM | POA: Diagnosis not present

## 2018-07-05 MED FILL — DEXTROAMPH TB 30MG NSTR 100: 30 | 30 days supply | Qty: 60 | Fill #0

## 2018-07-16 MED FILL — HYDROXYCHLOROQUINE SULFATE: 200 | 90 days supply | Qty: 180 | Fill #0

## 2018-08-26 MED FILL — AMPHETAMINE SALTS 30 MG TAB: 30 | 30 days supply | Qty: 60 | Fill #0

## 2018-09-30 MED FILL — AMPHETAMINE SALTS 30 MG TAB: 30 | 30 days supply | Qty: 60 | Fill #0

## 2018-10-24 DIAGNOSIS — M150 Primary generalized (osteo)arthritis: Secondary | ICD-10-CM | POA: Diagnosis not present

## 2018-10-24 DIAGNOSIS — M79643 Pain in unspecified hand: Secondary | ICD-10-CM | POA: Diagnosis not present

## 2018-10-24 DIAGNOSIS — M064 Inflammatory polyarthropathy: Secondary | ICD-10-CM | POA: Diagnosis not present

## 2018-10-24 DIAGNOSIS — Z79899 Other long term (current) drug therapy: Secondary | ICD-10-CM | POA: Diagnosis not present

## 2018-10-24 DIAGNOSIS — M3500 Sicca syndrome, unspecified: Secondary | ICD-10-CM | POA: Diagnosis not present

## 2018-10-24 DIAGNOSIS — M2550 Pain in unspecified joint: Secondary | ICD-10-CM | POA: Diagnosis not present

## 2018-10-24 DIAGNOSIS — M797 Fibromyalgia: Secondary | ICD-10-CM | POA: Diagnosis not present

## 2019-01-31 MED FILL — HYDROXYCHLOROQUINE 200 MG: 200 | 30 days supply | Qty: 60 | Fill #0

## 2019-02-03 MED FILL — IBUPROFEN 800 MG TAB: 800 | 90 days supply | Qty: 270 | Fill #0

## 2019-02-03 MED FILL — DULOXETINE HCL 60 MG CPEP: 60 | 90 days supply | Qty: 90 | Fill #0

## 2019-02-07 MED FILL — VENLAFAXINE HCL ER 75 MG CA: 75 | 90 days supply | Qty: 90 | Fill #0

## 2019-02-07 MED FILL — ESTRADIOL 2 MG TABLET: 2 | 90 days supply | Qty: 90 | Fill #0

## 2019-02-07 MED FILL — AMPHETAMINE-DEXTROAMPHETAMI: 30 | 30 days supply | Qty: 60 | Fill #0

## 2019-02-26 MED FILL — PREMARIN VAGINAL CREAM-APPL: 0.625 | 90 days supply | Qty: 30 | Fill #0

## 2019-03-14 MED FILL — HYDROXYCHLOROQUINE 200 MG: 200 | 30 days supply | Qty: 60 | Fill #1

## 2019-03-18 MED FILL — CLOBETASOL PROPIONATE 0.05: 0.05 | 30 days supply | Qty: 50 | Fill #0

## 2019-03-31 ENCOUNTER — Ambulatory Visit (INDEPENDENT_AMBULATORY_CARE_PROVIDER_SITE_OTHER): Payer: 59 | Admitting: Pharmacist

## 2019-03-31 ENCOUNTER — Other Ambulatory Visit: Payer: Self-pay

## 2019-03-31 DIAGNOSIS — Z79899 Other long term (current) drug therapy: Secondary | ICD-10-CM

## 2019-03-31 MED ORDER — ETANERCEPT 50 MG/ML ~~LOC~~ SOAJ: 50.0000 mg | SUBCUTANEOUS | 3 refills | Status: DC

## 2019-03-31 MED FILL — ENBREL SURECLICK 50 MG/ML S: 50 | 28 days supply | Qty: 4 | Fill #0

## 2019-03-31 MED FILL — AMPHETAMINE-DEXTROAMPHETAMI: 30 | 30 days supply | Qty: 60 | Fill #0

## 2019-03-31 NOTE — Progress Notes (Signed)
   S: Patient presents for review of their specialty medication therapy.  Patient is currently taking Enbrel for rheumatoid arthritis. Patient is managed by Dr. Dossie Der for this.   Adherence: has not started yet  Efficacy: has not started yet  Dosing: Ankylosing spondylitis, psoriatic arthritis, rheumatoid arthritis: SubQ: Note: May continue methotrexate, glucocorticoids, salicylates, NSAIDs, or analgesics during etanercept therapy. Once-weekly dosing: 50 mg once weekly; maximum dose (rheumatoid arthritis): 50 mg/week. Twice-weekly dosing (off-label dose): 25 mg twice weekly (Bathon 2000; Calin 2004; Davis 2003; Drucie Opitz 2002; Mease 2000; Mease 2002.   Screening: TB test: completed per patient Hepatitis B: completed per patient  Monitoring: Injection site reactions: has not started S/sx of infections: denies S/sx of malignancy: denies GI upset: has not started    O:     No results found for: WBC, HGB, HCT, MCV, PLT    Chemistry   No results found for: NA, K, CL, CO2, BUN, CREATININE, GLU No results found for: CALCIUM, ALKPHOS, AST, ALT, BILITOT     A/P: 1. Medication review: patient currently on Enbrel for rheumatoid arthritis but she has not started it yet. Reviewed the medication with the patient, including the following: Enbrel (etanercept) binds tumor necrosis factor (TNF) and blocks its interaction with cell surface receptors. TNF plays an important role in the inflammatory processes of many diseases. Patient educated on purpose, proper use and potential adverse effects of Enbrel. SubQ: Administer subcutaneously. Rotate injection sites; may inject into the thigh (preferred), abdomen (avoiding the 2-inch area around the navel), or outer areas of upper arm. New injections should be given at least 1 inch from an old site and never into areas where the skin is tender, bruised, red, or hard; any raised thick, red, or scaly skin patches or lesions; or areas with scars or stretch  marks. For a more comfortable injection, allow autoinjectors, prefilled syringes, and dose trays to reach room temperature for 15 to 30 minutes (?30 minutes for autoinjector) prior to injection; do not remove the needle cover while allowing product to reach room temperature. There may be small white particles of protein in the solution; this is not unusual for proteinaceous solutions. Possible adverse effects include rash, GI upset, increased risk of infection, and injection site reactions. Patients should stop Enbrel if they develop a serious infection. There is a possible increased risk in lymphoma and other malignancies. No recommendations for any changes.   Christella Hartigan, PharmD, BCPS, BCACP, CPP Clinical Pharmacist Practitioner  [phone]

## 2019-04-10 MED FILL — LOSARTAN POTASSIUM 100 MG T: 100 | 90 days supply | Qty: 90 | Fill #0

## 2019-04-10 MED FILL — HYDROXYCHLOROQUINE SULFATE: 200 | 90 days supply | Qty: 180 | Fill #0

## 2019-04-10 MED FILL — HYDROCHLOROTHIAZIDE 25 MG T: 25 | 90 days supply | Qty: 90 | Fill #0

## 2019-04-22 MED FILL — ENBREL SURECLICK 50 MG/ML S: 50 | 28 days supply | Qty: 4 | Fill #1

## 2019-04-28 MED FILL — IBUPROFEN 800 MG TAB: 800 | 90 days supply | Qty: 270 | Fill #0

## 2019-04-28 MED FILL — DULOXETINE HCL 60 MG CPEP: 60 | 90 days supply | Qty: 90 | Fill #0

## 2019-05-06 MED FILL — VENLAFAXINE HCL ER 75 MG CA: 75 | 90 days supply | Qty: 90 | Fill #0

## 2019-05-20 MED FILL — ENBREL SURECLICK 50 MG/ML S: 50 | 28 days supply | Qty: 4 | Fill #2

## 2019-05-21 MED FILL — AMPHETAMINE-DEXTROAMPHETAMI: 30 | 30 days supply | Qty: 60 | Fill #0

## 2019-05-21 MED FILL — ALBUTEROL SULFATE HFA 108 (: 108 (90 BAS | 16 days supply | Qty: 18 | Fill #0

## 2019-06-03 MED FILL — ALBUTEROL SULFATE HFA 108 (: 108 (90 BAS | 16 days supply | Qty: 18 | Fill #1

## 2019-06-17 MED FILL — ENBREL SURECLICK 50 MG/ML S: 50 | 28 days supply | Qty: 4 | Fill #3

## 2019-06-18 MED FILL — AMPHETAMINE-DEXTROAMPHETAMI: 30 | 30 days supply | Qty: 60 | Fill #0

## 2019-07-02 MED FILL — HYDROXYCHLOROQUINE 200 MG T: 200 | 90 days supply | Qty: 180 | Fill #0

## 2019-07-04 MED FILL — HYDROCHLOROTHIAZIDE 25 MG T: 25 | 90 days supply | Qty: 90 | Fill #1

## 2019-07-04 MED FILL — LOSARTAN POTASSIUM 100 MG T: 100 | 90 days supply | Qty: 90 | Fill #1

## 2019-07-10 ENCOUNTER — Telehealth (INDEPENDENT_AMBULATORY_CARE_PROVIDER_SITE_OTHER): Payer: Self-pay | Admitting: Pharmacist

## 2019-07-10 DIAGNOSIS — Z79899 Other long term (current) drug therapy: Secondary | ICD-10-CM

## 2019-07-10 MED ORDER — HUMIRA (2 PEN) 40 MG/0.4ML ~~LOC~~ AJKT: 40.0000 mg | AUTO-INJECTOR | SUBCUTANEOUS | 1 refills | Status: DC

## 2019-07-10 MED FILL — HUMIRA PEN 40 MG/0.4ML PNKT: 40 | 28 days supply | Qty: 2 | Fill #0

## 2019-07-10 NOTE — Progress Notes (Signed)
  S: Patient presents for review of their specialty medication therapy.  Patient is currently taking Humira for rheumatoid arthritis. Patient is managed by Dr. Dossie Der for this.   Adherence: has not started med but denies adherence issues when on Enbrel  Efficacy: recently failed Enbrel. Has not started Humira yet.  Dosing:  Rheumatoid arthritis: SubQ: 40 mg every other week (may continue methotrexate, other nonbiologic DMARDS, corticosteroids, NSAIDs, and/or analgesics); patients not taking concomitant methotrexate may increase dose to 40 mg every week  Dose adjustments: Renal: no dose adjustments (has not been studied) Hepatic: no dose adjustments (has not been studied)  Screening: TB test: completed per patient Hepatitis: completed per patient  Monitoring: S/sx of infection: denies CBC: WNL per patient S/sx of hypersensitivity: has not started med S/sx of malignancy: denies S/sx of heart failure: denies  Other side effects: has not started med yet  O:     No results found for: WBC, HGB, HCT, MCV, PLT    Chemistry   No results found for: NA, K, CL, CO2, BUN, CREATININE, GLU No results found for: CALCIUM, ALKPHOS, AST, ALT, BILITOT     A/P: 1. Medication review: Patient currently on Humira for rheumatoid arthritis but has not started it yet. Recently failed Enbrel. Reviewed the medication with the patient, including the following: Humira is a TNF blocking agent indicated for ankylosing spondylitis, Crohn's disease, Hidradenitis suppurativa, psoriatic arthritis, plaque psoriasis, ulcerative colitis, and uveitis. Patient educated on purpose, proper use and potential adverse effects of Humira. Possible adverse effects are increased risk of infections, headache, and injection site reactions. There is the possibility of an increased risk of malignancy but it is not well understood if this increased risk is due to there medication or the disease state. There are rare cases of  pancytopenia and aplastic anemia. For SubQ injection at separate sites in the thigh or lower abdomen (avoiding areas within 2 inches of navel); rotate injection sites. May leave at room temperature for ~15 to 30 minutes prior to use; do not remove cap or cover while allowing product to reach room temperature. Do not use if solution is discolored or contains particulate matter. Do not administer to skin which is red, tender, bruised, hard, or that has scars, stretch marks, or psoriasis plaques. Needle cap of the prefilled syringe or needle cover for the adalimumab pen may contain latex. Prefilled pens and syringes are available for use by patients and the full amount of the syringe should be injected (self-administration); the vial is intended for institutional use only. Vials do not contain a preservative; discard unused portion. No recommendations for any changes at this time.  Christella Hartigan, PharmD, BCPS, BCACP, CPP Clinical Pharmacist Practitioner  [phone]

## 2019-07-15 MED FILL — ESTRADIOL 2 MG TABS: 2 | 90 days supply | Qty: 90 | Fill #0

## 2019-07-21 MED FILL — DULOXETINE HCL 60 MG CPEP: 60 | 90 days supply | Qty: 90 | Fill #0

## 2019-07-30 MED FILL — VENLAFAXINE HCL ER 75 MG CA: 75 | 90 days supply | Qty: 90 | Fill #1

## 2019-08-01 MED FILL — HUMIRA PEN 40 MG/0.4ML PNKT: 40 | 28 days supply | Qty: 2 | Fill #1

## 2019-08-20 ENCOUNTER — Other Ambulatory Visit: Payer: Self-pay | Admitting: Obstetrics and Gynecology

## 2019-08-20 DIAGNOSIS — Z1231 Encounter for screening mammogram for malignant neoplasm of breast: Secondary | ICD-10-CM

## 2019-08-27 ENCOUNTER — Ambulatory Visit
Admission: RE | Admit: 2019-08-27 | Discharge: 2019-08-27 | Disposition: A | Discharge status: Home / Self Care | Payer: No Typology Code available for payment source | Source: Ambulatory Visit | Attending: Obstetrics and Gynecology | Admitting: Obstetrics and Gynecology

## 2019-08-27 ENCOUNTER — Other Ambulatory Visit: Payer: Self-pay

## 2019-08-27 DIAGNOSIS — Z1231 Encounter for screening mammogram for malignant neoplasm of breast: Secondary | ICD-10-CM

## 2019-08-29 ENCOUNTER — Other Ambulatory Visit: Payer: Self-pay | Admitting: Pharmacist

## 2019-08-29 MED ORDER — HUMIRA (2 PEN) 40 MG/0.4ML ~~LOC~~ AJKT: 40.0000 mg | AUTO-INJECTOR | SUBCUTANEOUS | 1 refills | Status: DC

## 2019-08-29 MED FILL — HUMIRA PEN 40 MG/0.4ML PNKT: 40 | 28 days supply | Qty: 2 | Fill #0

## 2019-09-04 ENCOUNTER — Other Ambulatory Visit: Payer: Self-pay | Admitting: Pharmacist

## 2019-09-04 MED ORDER — HUMIRA (2 PEN) 40 MG/0.4ML ~~LOC~~ AJKT: 40.0000 mg | AUTO-INJECTOR | SUBCUTANEOUS | 0 refills | Status: DC

## 2019-09-04 MED FILL — AMPHETAMINE-DEXTROAMPHETAMI: 30 | 30 days supply | Qty: 60 | Fill #0

## 2019-09-18 MED FILL — HYDROCHLOROTHIAZIDE 25 MG T: 25 | 90 days supply | Qty: 90 | Fill #0

## 2019-09-19 MED FILL — HYDROXYCHLOROQUINE 200 MG T: 200 | 90 days supply | Qty: 180 | Fill #0

## 2019-09-20 MED FILL — HUMIRA PEN 40 MG/0.4ML PNKT: 40 | 28 days supply | Qty: 2 | Fill #1

## 2019-10-01 MED FILL — LOSARTAN POTASSIUM 100 MG T: 100 | 90 days supply | Qty: 90 | Fill #0

## 2019-10-03 MED FILL — AMPHETAMINE-DEXTROAMPHETAMI: 30 | 30 days supply | Qty: 60 | Fill #0

## 2019-10-07 MED FILL — ESTRADIOL 2 MG TABS: 2 | 90 days supply | Qty: 90 | Fill #1

## 2019-10-13 MED FILL — DULOXETINE HCL 60 MG CPEP: 60 | 90 days supply | Qty: 90 | Fill #1

## 2019-10-18 MED FILL — HUMIRA PEN 40 MG/0.4ML PNKT: 40 | 28 days supply | Qty: 2 | Fill #0

## 2019-10-20 ENCOUNTER — Other Ambulatory Visit: Payer: Self-pay | Admitting: Pharmacist

## 2019-10-20 MED ORDER — HUMIRA (2 PEN) 40 MG/0.4ML ~~LOC~~ AJKT: 40.0000 mg | AUTO-INJECTOR | SUBCUTANEOUS | 0 refills | Status: DC

## 2019-10-27 MED FILL — VENLAFAXINE HCL ER 75 MG CA: 75 | 90 days supply | Qty: 90 | Fill #2

## 2019-11-05 MED FILL — METHOTREXATE SODIUM 2.5 MG: 2.5 | 30 days supply | Qty: 16 | Fill #0

## 2019-11-05 MED FILL — FOLIC ACID 1 MG TABS: 1 | 30 days supply | Qty: 30 | Fill #0

## 2019-11-07 MED FILL — valACYclovir HCL 1 GM TABS: 1 | 7 days supply | Qty: 21 | Fill #0

## 2019-11-25 MED FILL — HUMIRA PEN 40 MG/0.4ML PNKT: 40 | 28 days supply | Qty: 2 | Fill #0

## 2019-12-02 MED FILL — AMPHETAMINE-DEXTROAMPHETAMI: 30 | 30 days supply | Qty: 60 | Fill #0

## 2019-12-11 MED FILL — HYDROXYCHLOROQUINE 200 MG T: 200 | 90 days supply | Qty: 180 | Fill #0

## 2019-12-11 MED FILL — HYDROCHLOROTHIAZIDE 25 MG T: 25 | 90 days supply | Qty: 90 | Fill #1

## 2019-12-24 MED FILL — LOSARTAN POTASSIUM 100 MG T: 100 | 90 days supply | Qty: 90 | Fill #1

## 2020-01-05 MED FILL — ESTRADIOL 2 MG TABS: 2 | 90 days supply | Qty: 90 | Fill #2

## 2020-01-09 MED FILL — AMPHETAMINE-DEXTROAMPHETAMI: 30 | 30 days supply | Qty: 60 | Fill #0

## 2020-01-09 MED FILL — IBUPROFEN 800 MG TAB: 800 | 90 days supply | Qty: 270 | Fill #0

## 2020-01-13 ENCOUNTER — Other Ambulatory Visit: Payer: Self-pay | Admitting: Emergency Medicine

## 2020-01-13 ENCOUNTER — Ambulatory Visit: Payer: Self-pay

## 2020-01-13 ENCOUNTER — Other Ambulatory Visit: Payer: Self-pay

## 2020-01-13 DIAGNOSIS — M79641 Pain in right hand: Secondary | ICD-10-CM

## 2020-01-13 DIAGNOSIS — M25511 Pain in right shoulder: Secondary | ICD-10-CM

## 2020-01-13 DIAGNOSIS — M25531 Pain in right wrist: Secondary | ICD-10-CM

## 2020-01-26 MED FILL — VENLAFAXINE HCL ER 75 MG CA: 75 | 90 days supply | Qty: 90 | Fill #0

## 2020-02-18 MED FILL — AMPHETAMINE-DEXTROAMPHETAMI: 30 | 30 days supply | Qty: 60 | Fill #0

## 2020-03-22 MED FILL — HYDROXYCHLOROQUINE 200 MG T: 200 | 90 days supply | Qty: 180 | Fill #0

## 2020-03-23 MED FILL — LOSARTAN POTASSIUM 100 MG T: 100 | 90 days supply | Qty: 90 | Fill #2

## 2020-04-02 MED FILL — IBUPROFEN 800 MG TAB: 800 | 90 days supply | Qty: 270 | Fill #0

## 2020-04-02 MED FILL — AMPHETAMINE-DEXTROAMPHETAMI: 30 | 30 days supply | Qty: 60 | Fill #0

## 2020-04-14 MED FILL — METHYLPREDNISOLONE 4 MG TAB: 4 | 12 days supply | Qty: 42 | Fill #0

## 2020-05-24 MED FILL — CEPHALEXIN 500 MG CAPSULE: 500 | 7 days supply | Qty: 14 | Fill #0

## 2020-05-24 MED FILL — AMPHETAMINE-DEXTROAMPHETAMI: 30 | 30 days supply | Qty: 60 | Fill #0

## 2020-06-08 MED FILL — HYDROCHLOROTHIAZIDE 25 MG T: 25 | 90 days supply | Qty: 90 | Fill #3

## 2020-06-17 MED FILL — HYDROXYCHLOROQUINE 200 MG T: 200 | 90 days supply | Qty: 180 | Fill #0

## 2020-06-21 MED FILL — LOSARTAN POTASSIUM 100 MG T: 100 | 90 days supply | Qty: 90 | Fill #3

## 2020-07-06 ENCOUNTER — Other Ambulatory Visit: Payer: Self-pay

## 2020-07-06 ENCOUNTER — Ambulatory Visit (INDEPENDENT_AMBULATORY_CARE_PROVIDER_SITE_OTHER): Payer: No Typology Code available for payment source

## 2020-07-06 ENCOUNTER — Ambulatory Visit: Payer: No Typology Code available for payment source | Admitting: Podiatry

## 2020-07-06 DIAGNOSIS — M7750 Other enthesopathy of unspecified foot: Secondary | ICD-10-CM

## 2020-07-06 DIAGNOSIS — G2581 Restless legs syndrome: Secondary | ICD-10-CM | POA: Insufficient documentation

## 2020-07-06 DIAGNOSIS — K589 Irritable bowel syndrome without diarrhea: Secondary | ICD-10-CM | POA: Insufficient documentation

## 2020-07-06 DIAGNOSIS — M7741 Metatarsalgia, right foot: Secondary | ICD-10-CM | POA: Diagnosis not present

## 2020-07-06 DIAGNOSIS — M7742 Metatarsalgia, left foot: Secondary | ICD-10-CM

## 2020-07-06 DIAGNOSIS — M797 Fibromyalgia: Secondary | ICD-10-CM | POA: Insufficient documentation

## 2020-07-06 DIAGNOSIS — M1990 Unspecified osteoarthritis, unspecified site: Secondary | ICD-10-CM | POA: Insufficient documentation

## 2020-07-06 DIAGNOSIS — L4050 Arthropathic psoriasis, unspecified: Secondary | ICD-10-CM | POA: Diagnosis not present

## 2020-07-06 DIAGNOSIS — M778 Other enthesopathies, not elsewhere classified: Secondary | ICD-10-CM

## 2020-07-06 DIAGNOSIS — G43909 Migraine, unspecified, not intractable, without status migrainosus: Secondary | ICD-10-CM | POA: Insufficient documentation

## 2020-07-06 DIAGNOSIS — I10 Essential (primary) hypertension: Secondary | ICD-10-CM | POA: Insufficient documentation

## 2020-07-06 DIAGNOSIS — R5382 Chronic fatigue, unspecified: Secondary | ICD-10-CM | POA: Insufficient documentation

## 2020-07-07 ENCOUNTER — Other Ambulatory Visit (HOSPITAL_COMMUNITY): Payer: Self-pay | Admitting: Family Medicine

## 2020-07-07 MED FILL — DULOXETINE HCL 60 MG CPEP: 60 | 90 days supply | Qty: 90 | Fill #0

## 2020-07-07 NOTE — Progress Notes (Signed)
  Subjective:  Patient ID: Megan Tanner, female    DOB: 1966/02/20,  MRN: [id number]  Chief Complaint  Patient presents with  . Foot Pain     NP  Foot pain bilat    54 y.o. female presents with the above complaint. History confirmed with patient.  The right side has affected her for about 10 years across the ball of the foot.  She is a history of psoriatic arthritis and has been on numerous medications for this including Plaquenil and most recently Humira and Enbrel.  The left foot has become bothersome recently too.  Objective:  Physical Exam: warm, good capillary refill, no trophic changes or ulcerative lesions, normal DP and PT pulses and normal sensory exam.  Pes cavus foot type bilaterally.  Gastrocnemius equinus noted bilaterally Left Foot: Mild pain on palpation plantar MTPJs Right Foot: Moderate pain on palpation of the plantar MTPJ's, the most painful which is the second, especially with end range of motion in plantarflexion and dorsiflexion.  Thinning of the metatarsal fat pad  Radiographs: X-ray of the right foot: no fracture, dislocation, swelling or degenerative changes noted, posterior calcaneal spur and Pes cavus Assessment:   1. Capsulitis of right foot   2. Metatarsalgia of both feet   3. Psoriatic arthritis ([place])      Plan:  Patient was evaluated and treated and all questions answered.   -Discussed with the patient the etiology of her foot pain, now relates to her overall foot shape as well as her psoriatic arthritis.  We discussed the progression of systemic arthritis as a relates to the joints in the foot and ankle and that the primary treatment for this is medical therapy with her antipsoriatic drugs.  Secondary treatment in the foot is aimed at symptomatic pain relief, inflammation control and surgical intervention for arthritic joints.  She does not have radiographic findings of end-stage arthritis at this time.  I recommended today that we inject the most  painful joint, today which was the second MTPJ.  The right second PIPJ was then injected following sterile Betadine prep with 10 mg Kenalog, 4 mg of dexamethasone phosphate, and 0.5 cc of 2% Xylocaine plain.  She tolerated procedure well.  We also discussed shoe gear recommendations and wearing supportive shoes that will support her arch.  She will find out if her insurance covers custom molded orthotics.  Return in about 3 months (around 10/05/2020).

## 2020-07-11 ENCOUNTER — Encounter: Payer: Self-pay | Admitting: Podiatry

## 2020-08-19 ENCOUNTER — Other Ambulatory Visit (HOSPITAL_COMMUNITY): Payer: Self-pay | Admitting: Family Medicine

## 2020-08-19 MED FILL — AMPHETAMINE-DEXTROAMPHETAMI: 30 | 30 days supply | Qty: 60 | Fill #0

## 2020-09-07 ENCOUNTER — Other Ambulatory Visit (HOSPITAL_COMMUNITY): Payer: Self-pay | Admitting: Family Medicine

## 2020-09-07 MED FILL — HYDROCHLOROTHIAZIDE 25 MG T: 25 | 90 days supply | Qty: 90 | Fill #0

## 2020-09-20 ENCOUNTER — Other Ambulatory Visit (HOSPITAL_COMMUNITY): Payer: Self-pay | Admitting: Rheumatology

## 2020-09-20 MED FILL — LOSARTAN POTASSIUM 100 MG T: 100 | 90 days supply | Qty: 90 | Fill #0

## 2020-09-20 MED FILL — HYDROXYCHLOROQUINE 200 MG T: 200 | 90 days supply | Qty: 180 | Fill #0

## 2020-09-21 ENCOUNTER — Other Ambulatory Visit (HOSPITAL_COMMUNITY): Payer: Self-pay | Admitting: Rheumatology

## 2020-09-21 MED FILL — AMPHETAMINE-DEXTROAMPHETAMI: 30 | 30 days supply | Qty: 60 | Fill #0

## 2020-10-06 MED FILL — DULOXETINE HCL 60 MG CPEP: 60 | 90 days supply | Qty: 90 | Fill #1

## 2020-10-07 ENCOUNTER — Ambulatory Visit: Payer: No Typology Code available for payment source | Admitting: Podiatry

## 2020-10-25 ENCOUNTER — Other Ambulatory Visit (HOSPITAL_COMMUNITY): Payer: Self-pay | Admitting: Family Medicine

## 2020-10-25 MED FILL — METHOTREXATE SODIUM 2.5 MG: 2.5 | 28 days supply | Qty: 16 | Fill #1

## 2020-10-25 MED FILL — AZITHROMYCIN 250 MG TABLET: 250 | 5 days supply | Qty: 6 | Fill #0

## 2020-11-06 MED FILL — AMPHETAMINE-DEXTROAMPHETAMI: 30 | 30 days supply | Qty: 60 | Fill #0

## 2020-11-12 ENCOUNTER — Other Ambulatory Visit: Payer: Self-pay

## 2020-11-12 ENCOUNTER — Other Ambulatory Visit: Payer: Self-pay | Admitting: Internal Medicine

## 2020-11-12 ENCOUNTER — Telehealth: Payer: Self-pay | Admitting: Pharmacist

## 2020-11-12 ENCOUNTER — Ambulatory Visit (HOSPITAL_BASED_OUTPATIENT_CLINIC_OR_DEPARTMENT_OTHER): Payer: No Typology Code available for payment source | Admitting: Pharmacist

## 2020-11-12 DIAGNOSIS — Z79899 Other long term (current) drug therapy: Secondary | ICD-10-CM

## 2020-11-12 MED ORDER — COSENTYX SENSOREADY (300 MG) 150 MG/ML ~~LOC~~ SOAJ: SUBCUTANEOUS | 3 refills | Status: DC

## 2020-11-12 NOTE — Telephone Encounter (Signed)
Called patient to schedule an appointment for the [facility]. I was unable to reach the patient so I left a HIPAA-compliant message requesting that the patient return my call.   

## 2020-11-12 NOTE — Progress Notes (Signed)
   S: Patient presents today for review of their specialty medication.   Patient is currently taking Cosentyx (secukinumab) for RA. Patient is managed by Dr. Dossie Der for this.   Dosing: 150 mg SQ once a month.  Adherence: has not yet started   Efficacy: has not yet started   Current adverse effects: S/sx of infection: none  GI upset: has not yet started; counseling given Headache: has not yet started; counseling given S/sx of hypersensitivity: has not yet started; counseling given  O:   No results found for: WBC, HGB, HCT, MCV, PLT    Chemistry   No results found for: NA, K, CL, CO2, BUN, CREATININE, GLU No results found for: CALCIUM, ALKPHOS, AST, ALT, BILITOT     A/P: 1. Medication review: Patient is currently on Cosentyx for RA. Reviewed the medication with the patient, including the following: Cosentyx is a monoclonal antibody used in the treatment of ankylosing spondylitis, psoriasis, and psoriatic arthritis. The injection is subq and the medication should be allowed to reach room temp prior to injecting. Injection sites should be rotated. Possible adverse effects include headaches, GI upset, increased risk of infection and hypersensitivity reactions. No recommendations for any changes at this time.   Benard Halsted, PharmD, [facility] [place], [place] [phone]

## 2020-11-17 ENCOUNTER — Other Ambulatory Visit (HOSPITAL_COMMUNITY): Payer: Self-pay | Admitting: Family Medicine

## 2020-11-17 MED FILL — AMOXICILLIN 875 MG TABS: 875 | 10 days supply | Qty: 20 | Fill #0

## 2020-11-23 ENCOUNTER — Other Ambulatory Visit (HOSPITAL_COMMUNITY): Payer: Self-pay | Admitting: Rheumatology

## 2020-12-07 MED FILL — COSENTYX 300 MG DOSE-2 PENS: 150 | 28 days supply | Qty: 1 | Fill #0

## 2020-12-08 MED FILL — AMPHETAMINE-DEXTROAMPHETAMI: 30 | 30 days supply | Qty: 60 | Fill #0

## 2020-12-14 ENCOUNTER — Other Ambulatory Visit: Payer: Self-pay | Admitting: Pharmacist

## 2020-12-14 MED ORDER — COSENTYX SENSOREADY (300 MG) 150 MG/ML ~~LOC~~ SOAJ: SUBCUTANEOUS | 0 refills | Status: DC

## 2020-12-14 MED FILL — HYDROXYCHLOROQUINE 200 MG T: 200 | 90 days supply | Qty: 180 | Fill #0

## 2021-01-05 ENCOUNTER — Other Ambulatory Visit (HOSPITAL_COMMUNITY): Payer: Self-pay | Admitting: Rheumatology

## 2021-01-05 MED FILL — DULOXETINE HCL 60 MG CPEP: 60 | 90 days supply | Qty: 90 | Fill #0

## 2021-01-05 MED FILL — METHOTREXATE SODIUM 2.5 MG: 2.5 | 90 days supply | Qty: 52 | Fill #0

## 2021-01-05 MED FILL — AMPHETAMINE-DEXTROAMPHETAMI: 30 | 30 days supply | Qty: 60 | Fill #0

## 2021-01-06 MED FILL — COSENTYX 300 MG DOSE-2 PENS: 150 | 28 days supply | Qty: 1 | Fill #1

## 2021-01-12 ENCOUNTER — Other Ambulatory Visit: Payer: Self-pay | Admitting: Pharmacist

## 2021-01-12 MED ORDER — COSENTYX SENSOREADY (300 MG) 150 MG/ML ~~LOC~~ SOAJ: SUBCUTANEOUS | 3 refills | Status: DC

## 2021-01-19 ENCOUNTER — Other Ambulatory Visit (HOSPITAL_COMMUNITY): Payer: Self-pay | Admitting: Rheumatology

## 2021-01-19 MED FILL — IBUPROFEN 800 MG TAB: 800 | 90 days supply | Qty: 270 | Fill #0

## 2021-01-19 MED FILL — predniSONE 10 MG TABS: 10 | 30 days supply | Qty: 30 | Fill #0

## 2021-01-20 ENCOUNTER — Other Ambulatory Visit: Payer: Self-pay | Admitting: Obstetrics and Gynecology

## 2021-01-20 DIAGNOSIS — Z1231 Encounter for screening mammogram for malignant neoplasm of breast: Secondary | ICD-10-CM

## 2021-01-26 MED FILL — LOSARTAN POTASSIUM 100 MG T: 100 | 90 days supply | Qty: 90 | Fill #0

## 2021-01-28 ENCOUNTER — Other Ambulatory Visit (HOSPITAL_COMMUNITY): Payer: Self-pay

## 2021-02-02 ENCOUNTER — Encounter: Payer: Self-pay | Admitting: Family

## 2021-02-02 ENCOUNTER — Telehealth: Payer: No Typology Code available for payment source | Admitting: Family

## 2021-02-02 ENCOUNTER — Other Ambulatory Visit (HOSPITAL_COMMUNITY): Payer: Self-pay

## 2021-02-02 DIAGNOSIS — R21 Rash and other nonspecific skin eruption: Secondary | ICD-10-CM

## 2021-02-02 MED ORDER — EUCRISA 2 % EX OINT
1.0000 "application " | TOPICAL_OINTMENT | Freq: Two times a day (BID) | CUTANEOUS | 1 refills | Status: DC
  Filled 2021-02-02: qty 100, 90d supply, fill #0
  Filled 2021-02-03: qty 100, 30d supply, fill #0

## 2021-02-02 MED ORDER — PREDNISONE 10 MG PO TABS
ORAL_TABLET | ORAL | 0 refills | Status: DC
  Filled 2021-02-02 – 2021-02-03 (×2): qty 21, 6d supply, fill #0

## 2021-02-02 NOTE — Progress Notes (Signed)
   Virtual Visit  Note Due to COVID-19 pandemic this visit was conducted virtually. This visit type was conducted due to national recommendations for restrictions regarding the COVID-19 Pandemic (e.g. social distancing, sheltering in place) in an effort to limit this patient's exposure and mitigate transmission in our community. All issues noted in this document were discussed and addressed.  A physical exam was not performed with this format.  I connected with Megan Tanner on 02/02/21 at 4:22 pm by video and verified that I am speaking with the correct person using two identifiers. Megan Tanner is currently located at home and no one is currently with her during visit. The provider, Evelina Dun, FNP is located in their office at time of visit.  I discussed the limitations, risks, security and privacy concerns of performing an evaluation and management service by video and the availability of in person appointments. I also discussed with the patient that there may be a patient responsible charge related to this service. The patient expressed understanding and agreed to proceed.   History and Present Illness:  PT presents today with a rash on her face that she noticed several weeks ago. She stopped methotrexate because she thought this was causing the rash, but states she stopped it two weeks and no improvement.  Rash This is a new problem. The current episode started 1 to 4 weeks ago. The problem has been waxing and waning since onset. The affected locations include the face. The rash is characterized by swelling and redness. She was exposed to a new medication. Pertinent negatives include no congestion, cough, diarrhea, facial edema, fatigue, fever or joint pain. Past treatments include nothing. The treatment provided no relief.     Review of Systems  Constitutional: Negative for fatigue and fever.  HENT: Negative for congestion.   Respiratory: Negative for cough.   Gastrointestinal:  Negative for diarrhea.  Musculoskeletal: Negative for joint pain.  Skin: Positive for rash.  All other systems reviewed and are negative.    Observations/Objective: No SOB or distress noted, erythemas papule rash under bilateral eyes that extend to temple area.   Assessment and Plan: 1. Rash of face -Pt will call Rheumatologists tomorrow to discuss rash -Do not scratch Go to ED if any swelling of tongue, SOB, or tongue  - predniSONE (STERAPRED UNI-PAK 21 TAB) 10 MG (21) TBPK tablet; Use as directed  Dispense: 21 tablet; Refill: 0 - Crisaborole (EUCRISA) 2 % OINT; Apply 1 application topically in the morning and at bedtime.  Dispense: 100 g; Refill: 1     I discussed the assessment and treatment plan with the patient. The patient was provided an opportunity to ask questions and all were answered. The patient agreed with the plan and demonstrated an understanding of the instructions.   The patient was advised to call back or seek an in-person evaluation if the symptoms worsen or if the condition fails to improve as anticipated.  The above assessment and management plan was discussed with the patient. The patient verbalized understanding of and has agreed to the management plan. Patient is aware to call the clinic if symptoms persist or worsen. Patient is aware when to return to the clinic for a follow-up visit. Patient educated on when it is appropriate to go to the emergency department.   Time call ended: 4:45 pm     I provided 23 minutes of   face-to-face time during this encounter.    Evelina Dun, FNP

## 2021-02-03 ENCOUNTER — Other Ambulatory Visit (HOSPITAL_COMMUNITY): Payer: Self-pay

## 2021-02-10 ENCOUNTER — Other Ambulatory Visit (HOSPITAL_COMMUNITY): Payer: Self-pay

## 2021-02-10 MED FILL — Secukinumab Subcutaneous Auto-inj 150 MG/ML (300 MG Dose): SUBCUTANEOUS | 28 days supply | Qty: 2 | Fill #0 | Status: AC

## 2021-02-11 ENCOUNTER — Other Ambulatory Visit: Payer: Self-pay

## 2021-02-11 ENCOUNTER — Other Ambulatory Visit (HOSPITAL_COMMUNITY): Payer: Self-pay

## 2021-02-11 ENCOUNTER — Ambulatory Visit
Admission: RE | Admit: 2021-02-11 | Discharge: 2021-02-11 | Disposition: A | Discharge status: Home / Self Care | Payer: No Typology Code available for payment source | Source: Ambulatory Visit

## 2021-02-11 DIAGNOSIS — Z1231 Encounter for screening mammogram for malignant neoplasm of breast: Secondary | ICD-10-CM

## 2021-02-23 ENCOUNTER — Other Ambulatory Visit (HOSPITAL_COMMUNITY): Payer: Self-pay

## 2021-02-23 MED FILL — Amphetamine-Dextroamphetamine Tab 30 MG: ORAL | 30 days supply | Qty: 60 | Fill #0 | Status: AC

## 2021-03-02 ENCOUNTER — Other Ambulatory Visit (HOSPITAL_COMMUNITY): Payer: Self-pay

## 2021-03-02 MED ORDER — VENLAFAXINE HCL ER 75 MG PO CP24
ORAL_CAPSULE | ORAL | 6 refills | Status: DC
  Filled 2021-03-02: qty 90, 90d supply, fill #0

## 2021-03-03 ENCOUNTER — Other Ambulatory Visit (HOSPITAL_COMMUNITY): Payer: Self-pay

## 2021-03-03 MED FILL — Secukinumab Subcutaneous Auto-inj 150 MG/ML (300 MG Dose): SUBCUTANEOUS | 28 days supply | Qty: 2 | Fill #1 | Status: AC

## 2021-03-04 ENCOUNTER — Other Ambulatory Visit (HOSPITAL_COMMUNITY): Payer: Self-pay

## 2021-03-07 ENCOUNTER — Other Ambulatory Visit (HOSPITAL_COMMUNITY): Payer: Self-pay

## 2021-03-22 ENCOUNTER — Other Ambulatory Visit (HOSPITAL_COMMUNITY): Payer: Self-pay

## 2021-03-22 MED FILL — Duloxetine HCl Enteric Coated Pellets Cap 60 MG (Base Eq): ORAL | 90 days supply | Qty: 90 | Fill #0 | Status: AC

## 2021-03-23 ENCOUNTER — Other Ambulatory Visit (HOSPITAL_COMMUNITY): Payer: Self-pay

## 2021-03-23 ENCOUNTER — Other Ambulatory Visit (HOSPITAL_BASED_OUTPATIENT_CLINIC_OR_DEPARTMENT_OTHER): Payer: Self-pay

## 2021-03-23 ENCOUNTER — Other Ambulatory Visit: Payer: Self-pay

## 2021-03-23 MED ORDER — HYDROXYCHLOROQUINE SULFATE 200 MG PO TABS
ORAL_TABLET | ORAL | 1 refills | Status: DC
  Filled 2021-03-23: qty 180, 90d supply, fill #0
  Filled 2021-06-30: qty 180, 90d supply, fill #1

## 2021-03-23 MED ORDER — AMPHETAMINE-DEXTROAMPHETAMINE 30 MG PO TABS
ORAL_TABLET | ORAL | 0 refills | Status: DC
  Filled 2021-03-23: qty 60, 30d supply, fill #0

## 2021-03-23 MED FILL — Losartan Potassium Tab 100 MG: ORAL | 90 days supply | Qty: 90 | Fill #0 | Status: CN

## 2021-03-23 MED FILL — Hydrochlorothiazide Tab 25 MG: ORAL | 90 days supply | Qty: 90 | Fill #0 | Status: AC

## 2021-04-01 ENCOUNTER — Other Ambulatory Visit (HOSPITAL_COMMUNITY): Payer: Self-pay

## 2021-04-01 MED FILL — Secukinumab Subcutaneous Auto-inj 150 MG/ML (300 MG Dose): SUBCUTANEOUS | 28 days supply | Qty: 2 | Fill #2 | Status: AC

## 2021-04-05 ENCOUNTER — Other Ambulatory Visit (HOSPITAL_COMMUNITY): Payer: Self-pay

## 2021-04-18 ENCOUNTER — Other Ambulatory Visit (HOSPITAL_COMMUNITY): Payer: Self-pay

## 2021-04-18 MED ORDER — FLUCONAZOLE 150 MG PO TABS
150.0000 mg | ORAL_TABLET | Freq: Every day | ORAL | 1 refills | Status: DC
  Filled 2021-04-18: qty 1, 1d supply, fill #0
  Filled 2021-12-28: qty 1, 1d supply, fill #1

## 2021-05-04 ENCOUNTER — Other Ambulatory Visit (HOSPITAL_COMMUNITY): Payer: Self-pay

## 2021-05-04 MED FILL — Losartan Potassium Tab 100 MG: ORAL | 90 days supply | Qty: 90 | Fill #0 | Status: AC

## 2021-05-04 MED FILL — Ibuprofen Tab 800 MG: ORAL | 90 days supply | Qty: 270 | Fill #0 | Status: AC

## 2021-05-05 ENCOUNTER — Other Ambulatory Visit (HOSPITAL_COMMUNITY): Payer: Self-pay

## 2021-05-05 MED ORDER — AMPHETAMINE-DEXTROAMPHETAMINE 30 MG PO TABS
ORAL_TABLET | ORAL | 0 refills | Status: DC
  Filled 2021-05-05: qty 60, 30d supply, fill #0

## 2021-05-14 ENCOUNTER — Encounter (INDEPENDENT_AMBULATORY_CARE_PROVIDER_SITE_OTHER): Payer: Self-pay

## 2021-05-18 ENCOUNTER — Other Ambulatory Visit (HOSPITAL_COMMUNITY): Payer: Self-pay

## 2021-05-18 MED ORDER — AMPHETAMINE-DEXTROAMPHETAMINE 30 MG PO TABS
ORAL_TABLET | ORAL | 0 refills | Status: DC
  Filled 2021-06-18: qty 60, 30d supply, fill #0

## 2021-05-18 MED ORDER — AMPHETAMINE-DEXTROAMPHETAMINE 30 MG PO TABS: ORAL_TABLET | ORAL | 0 refills | Status: DC

## 2021-05-18 MED ORDER — FLUCONAZOLE 150 MG PO TABS
ORAL_TABLET | ORAL | 0 refills | Status: DC
  Filled 2021-05-18: qty 2, 7d supply, fill #0

## 2021-05-18 MED ORDER — DOXYCYCLINE HYCLATE 100 MG PO TABS
ORAL_TABLET | ORAL | 0 refills | Status: DC
  Filled 2021-05-18: qty 20, 10d supply, fill #0

## 2021-05-18 MED ORDER — AMPHETAMINE-DEXTROAMPHETAMINE 30 MG PO TABS
ORAL_TABLET | ORAL | 0 refills | Status: DC
  Filled 2021-07-01: qty 60, 30d supply, fill #0
  Filled 2021-10-10: qty 60, fill #0
  Filled 2021-10-28: qty 60, 30d supply, fill #0

## 2021-05-24 ENCOUNTER — Other Ambulatory Visit (HOSPITAL_COMMUNITY): Payer: Self-pay

## 2021-05-25 ENCOUNTER — Other Ambulatory Visit (HOSPITAL_COMMUNITY): Payer: Self-pay

## 2021-06-09 ENCOUNTER — Other Ambulatory Visit (HOSPITAL_COMMUNITY): Payer: Self-pay

## 2021-06-09 MED ORDER — SERTRALINE HCL 50 MG PO TABS
50.0000 mg | ORAL_TABLET | Freq: Every day | ORAL | 6 refills | Status: DC
  Filled 2021-06-09: qty 30, 30d supply, fill #0
  Filled 2021-06-30 – 2021-07-06 (×2): qty 30, 30d supply, fill #1
  Filled 2021-07-31: qty 30, 30d supply, fill #2

## 2021-06-18 ENCOUNTER — Other Ambulatory Visit (HOSPITAL_COMMUNITY): Payer: Self-pay

## 2021-06-30 ENCOUNTER — Other Ambulatory Visit (HOSPITAL_COMMUNITY): Payer: Self-pay

## 2021-06-30 MED FILL — Losartan Potassium Tab 100 MG: ORAL | 90 days supply | Qty: 90 | Fill #1 | Status: CN

## 2021-06-30 MED FILL — Hydrochlorothiazide Tab 25 MG: ORAL | 90 days supply | Qty: 90 | Fill #1 | Status: AC

## 2021-07-01 ENCOUNTER — Other Ambulatory Visit (HOSPITAL_COMMUNITY): Payer: Self-pay

## 2021-07-06 ENCOUNTER — Other Ambulatory Visit (HOSPITAL_COMMUNITY): Payer: Self-pay

## 2021-07-13 ENCOUNTER — Other Ambulatory Visit (HOSPITAL_COMMUNITY): Payer: Self-pay

## 2021-07-13 MED ORDER — PREDNISONE 5 MG PO TABS
ORAL_TABLET | ORAL | 3 refills | Status: DC
  Filled 2021-07-13: qty 60, 30d supply, fill #0
  Filled 2021-08-29: qty 60, 30d supply, fill #1
  Filled 2021-11-06: qty 60, 30d supply, fill #2

## 2021-07-21 ENCOUNTER — Telehealth: Payer: Self-pay | Admitting: Pharmacist

## 2021-07-21 ENCOUNTER — Other Ambulatory Visit (HOSPITAL_COMMUNITY): Payer: Self-pay

## 2021-07-21 MED ORDER — ABATACEPT 125 MG/ML ~~LOC~~ SOSY
PREFILLED_SYRINGE | SUBCUTANEOUS | 4 refills | Status: DC
  Filled 2021-07-21: qty 4, 28d supply, fill #0

## 2021-07-21 NOTE — Telephone Encounter (Signed)
Called patient to schedule an appointment for the [facility]. I was unable to reach the patient so I left a HIPAA-compliant message requesting that the patient return my call.   Luke Van Ausdall, PharmD, BCACP, CPP Clinical Pharmacist [facility] [phone]  

## 2021-07-27 ENCOUNTER — Other Ambulatory Visit (HOSPITAL_COMMUNITY): Payer: Self-pay

## 2021-07-28 ENCOUNTER — Other Ambulatory Visit (HOSPITAL_COMMUNITY): Payer: Self-pay

## 2021-07-28 MED FILL — Ibuprofen Tab 800 MG: ORAL | 90 days supply | Qty: 270 | Fill #1 | Status: AC

## 2021-07-28 MED FILL — Losartan Potassium Tab 100 MG: ORAL | 90 days supply | Qty: 90 | Fill #1 | Status: AC

## 2021-07-29 ENCOUNTER — Other Ambulatory Visit (HOSPITAL_COMMUNITY): Payer: Self-pay

## 2021-07-30 ENCOUNTER — Other Ambulatory Visit (HOSPITAL_COMMUNITY): Payer: Self-pay

## 2021-07-30 MED ORDER — AMPHETAMINE-DEXTROAMPHETAMINE 30 MG PO TABS
ORAL_TABLET | ORAL | 0 refills | Status: DC
  Filled 2021-07-30: qty 60, 30d supply, fill #0

## 2021-07-31 MED FILL — Losartan Potassium Tab 100 MG: ORAL | 90 days supply | Qty: 90 | Fill #2 | Status: CN

## 2021-08-01 ENCOUNTER — Other Ambulatory Visit (HOSPITAL_COMMUNITY): Payer: Self-pay

## 2021-08-08 ENCOUNTER — Ambulatory Visit: Payer: No Typology Code available for payment source | Attending: Family Medicine | Admitting: Pharmacist

## 2021-08-08 ENCOUNTER — Other Ambulatory Visit: Payer: Self-pay

## 2021-08-08 ENCOUNTER — Other Ambulatory Visit (HOSPITAL_COMMUNITY): Payer: Self-pay

## 2021-08-08 DIAGNOSIS — Z79899 Other long term (current) drug therapy: Secondary | ICD-10-CM

## 2021-08-08 MED ORDER — ABATACEPT 125 MG/ML ~~LOC~~ SOAJ
SUBCUTANEOUS | 4 refills | Status: DC
  Filled 2021-08-08 – 2021-08-23 (×2): qty 4, 28d supply, fill #0
  Filled 2021-10-05: qty 4, 28d supply, fill #1
  Filled 2021-11-06: qty 4, 28d supply, fill #2

## 2021-08-08 NOTE — Progress Notes (Signed)
   S: Patient presents for review of their specialty medication therapy.  Patient is currently taking Orencia for RA. Patient is managed by Dr. Dossie Der for this.   Adherence: has not yet started. Her specialist plans to complete Shingrix vaccination in November and then have her start [place] then.  FDA-approved dosing: SubQ: 125 mg subQ once weekly.  Dose adjustments: Renal impairment: none Hepatic impairment: none Toxicity: discontinue if serious infection develops  Drug-drug interactions: none identified   Screenings: TB screening: completed  Hepatitis Screening: completed  Blood glucose: Orencia contains maltose which make falsely elevate glucose levels  Monitoring: S/sx of infection: none  S/sx of hypersensitivity: none, has not yet started  Other adverse effects: none, has not yet started   O:     No results found for: WBC, HGB, HCT, MCV, PLT    Chemistry   No results found for: NA, K, CL, CO2, BUN, CREATININE, GLU No results found for: CALCIUM, ALKPHOS, AST, ALT, BILITOT     A/P: 1. Medication review: Patient currently prescribed Orencia for the treatment of rheumatoid arthritis. Reviewed the medication with the patient, including the following: Orencia is a selective T-cell costimulation blocker indicated for rheumatoid arthritis. Patient educated on purpose, proper use and potential adverse effects of Orencia. The most common adverse effects are infections, headache, and injection site reactions. There is a possible adverse effect of increased risk of malignancy but it is not fully understood if this is due to the drug or the disease state itself. The patient was instructed to avoid use of live vaccinations without the approval of a physician. IV: Infuse over 30 minutes. Administer through a 0.2 to 1.2 micron low protein-binding filter. SubQ: Allow prefilled syringe and autoinjector to warm to room temperature (for 30 to 60 minutes and 30 minutes, respectively) prior to  administration. Inject into the front of the thigh (preferred), abdomen (except for 2-inch area around the navel), or the outer area of the upper arms (if administered by a caregiver). Rotate injection sites (?1 inch apart); do not administer into tender, bruised, red, or hard skin. No recommendations for any changes.  Benard Halsted, PharmD, [place], [place] [phone]

## 2021-08-15 ENCOUNTER — Other Ambulatory Visit (HOSPITAL_COMMUNITY): Payer: Self-pay

## 2021-08-15 MED ORDER — HYOSCYAMINE SULFATE SL 0.125 MG SL SUBL
SUBLINGUAL_TABLET | SUBLINGUAL | 1 refills | Status: AC
  Filled 2021-08-15: qty 30, 10d supply, fill #0
  Filled 2022-04-14: qty 30, 10d supply, fill #1

## 2021-08-15 MED ORDER — SERTRALINE HCL 50 MG PO TABS
ORAL_TABLET | ORAL | 3 refills | Status: DC
  Filled 2021-08-15 – 2021-08-29 (×2): qty 90, 90d supply, fill #0

## 2021-08-15 MED ORDER — AMPHETAMINE-DEXTROAMPHETAMINE 30 MG PO TABS
ORAL_TABLET | ORAL | 0 refills | Status: DC
  Filled 2021-12-28: qty 60, fill #0
  Filled 2021-12-28: qty 25, 13d supply, fill #0

## 2021-08-15 MED ORDER — AMPHETAMINE-DEXTROAMPHETAMINE 30 MG PO TABS
ORAL_TABLET | ORAL | 0 refills | Status: DC
  Filled 2021-11-30: qty 60, 30d supply, fill #0

## 2021-08-15 MED ORDER — AMPHETAMINE-DEXTROAMPHETAMINE 30 MG PO TABS
ORAL_TABLET | ORAL | 0 refills | Status: DC
  Filled 2021-09-26: qty 60, 30d supply, fill #0

## 2021-08-17 ENCOUNTER — Other Ambulatory Visit (HOSPITAL_COMMUNITY): Payer: Self-pay

## 2021-08-19 ENCOUNTER — Other Ambulatory Visit (HOSPITAL_COMMUNITY): Payer: Self-pay

## 2021-08-22 ENCOUNTER — Other Ambulatory Visit (HOSPITAL_COMMUNITY): Payer: Self-pay

## 2021-08-23 ENCOUNTER — Other Ambulatory Visit (HOSPITAL_COMMUNITY): Payer: Self-pay

## 2021-08-25 ENCOUNTER — Other Ambulatory Visit (HOSPITAL_COMMUNITY): Payer: Self-pay

## 2021-08-29 ENCOUNTER — Other Ambulatory Visit (HOSPITAL_COMMUNITY): Payer: Self-pay

## 2021-09-05 ENCOUNTER — Other Ambulatory Visit (HOSPITAL_COMMUNITY): Payer: Self-pay

## 2021-09-10 ENCOUNTER — Other Ambulatory Visit (HOSPITAL_COMMUNITY): Payer: Self-pay

## 2021-09-12 ENCOUNTER — Other Ambulatory Visit (HOSPITAL_COMMUNITY): Payer: Self-pay

## 2021-09-13 ENCOUNTER — Other Ambulatory Visit (HOSPITAL_COMMUNITY): Payer: Self-pay

## 2021-09-19 ENCOUNTER — Other Ambulatory Visit (HOSPITAL_COMMUNITY): Payer: Self-pay

## 2021-09-21 ENCOUNTER — Other Ambulatory Visit (HOSPITAL_COMMUNITY): Payer: Self-pay

## 2021-09-23 ENCOUNTER — Other Ambulatory Visit (HOSPITAL_COMMUNITY): Payer: Self-pay

## 2021-09-23 MED ORDER — ORENCIA 125 MG/ML ~~LOC~~ SOSY: PREFILLED_SYRINGE | SUBCUTANEOUS | 0 refills | Status: DC

## 2021-09-26 ENCOUNTER — Other Ambulatory Visit (HOSPITAL_COMMUNITY): Payer: Self-pay

## 2021-09-26 MED ORDER — HYDROXYCHLOROQUINE SULFATE 200 MG PO TABS
ORAL_TABLET | ORAL | 1 refills | Status: DC
  Filled 2021-09-26: qty 180, 90d supply, fill #0
  Filled 2021-12-28: qty 180, 90d supply, fill #1

## 2021-10-03 ENCOUNTER — Other Ambulatory Visit (HOSPITAL_COMMUNITY): Payer: Self-pay

## 2021-10-05 ENCOUNTER — Other Ambulatory Visit (HOSPITAL_COMMUNITY): Payer: Self-pay

## 2021-10-06 ENCOUNTER — Other Ambulatory Visit (HOSPITAL_COMMUNITY): Payer: Self-pay

## 2021-10-10 ENCOUNTER — Other Ambulatory Visit (HOSPITAL_COMMUNITY): Payer: Self-pay

## 2021-10-10 MED ORDER — AMPHETAMINE-DEXTROAMPHETAMINE 30 MG PO TABS
30.0000 mg | ORAL_TABLET | Freq: Two times a day (BID) | ORAL | 0 refills | Status: DC
  Filled 2022-02-04: qty 60, 30d supply, fill #0

## 2021-10-10 MED FILL — Hydrochlorothiazide Tab 25 MG: ORAL | 90 days supply | Qty: 90 | Fill #2 | Status: AC

## 2021-10-11 ENCOUNTER — Other Ambulatory Visit (HOSPITAL_COMMUNITY): Payer: Self-pay

## 2021-10-13 ENCOUNTER — Other Ambulatory Visit (HOSPITAL_COMMUNITY): Payer: Self-pay

## 2021-10-13 MED ORDER — SERTRALINE HCL 100 MG PO TABS
ORAL_TABLET | ORAL | 3 refills | Status: DC
  Filled 2021-10-13: qty 90, 90d supply, fill #0
  Filled 2022-02-04: qty 90, 90d supply, fill #1
  Filled 2022-06-19: qty 90, 90d supply, fill #2

## 2021-10-14 ENCOUNTER — Other Ambulatory Visit (HOSPITAL_COMMUNITY): Payer: Self-pay

## 2021-10-31 ENCOUNTER — Other Ambulatory Visit (HOSPITAL_COMMUNITY): Payer: Self-pay

## 2021-11-03 ENCOUNTER — Other Ambulatory Visit (HOSPITAL_COMMUNITY): Payer: Self-pay

## 2021-11-06 MED FILL — Losartan Potassium Tab 100 MG: ORAL | 90 days supply | Qty: 90 | Fill #2 | Status: AC

## 2021-11-06 MED FILL — Ibuprofen Tab 800 MG: ORAL | 90 days supply | Qty: 270 | Fill #2 | Status: AC

## 2021-11-06 MED FILL — Hydrochlorothiazide Tab 25 MG: ORAL | 90 days supply | Qty: 90 | Fill #3 | Status: CN

## 2021-11-07 ENCOUNTER — Other Ambulatory Visit (HOSPITAL_COMMUNITY): Payer: Self-pay

## 2021-11-30 ENCOUNTER — Other Ambulatory Visit (HOSPITAL_COMMUNITY): Payer: Self-pay

## 2021-11-30 MED ORDER — LOSARTAN POTASSIUM 100 MG PO TABS
ORAL_TABLET | ORAL | 3 refills | Status: DC
  Filled 2021-11-30 – 2022-02-04 (×2): qty 90, 90d supply, fill #0
  Filled 2022-05-02: qty 90, 90d supply, fill #1
  Filled 2022-07-31: qty 90, 90d supply, fill #2
  Filled 2022-11-02: qty 90, 90d supply, fill #3

## 2021-11-30 MED ORDER — HYDROCHLOROTHIAZIDE 25 MG PO TABS
ORAL_TABLET | ORAL | 3 refills | Status: DC
  Filled 2021-11-30 – 2021-12-28 (×2): qty 90, 90d supply, fill #0
  Filled 2022-04-05: qty 90, 90d supply, fill #1
  Filled 2022-07-31: qty 90, 90d supply, fill #2
  Filled 2022-11-02: qty 90, 90d supply, fill #3

## 2021-11-30 MED ORDER — RIZATRIPTAN BENZOATE 10 MG PO TABS
ORAL_TABLET | ORAL | 6 refills | Status: AC
  Filled 2021-11-30: qty 9, 30d supply, fill #0

## 2021-12-05 ENCOUNTER — Other Ambulatory Visit (HOSPITAL_COMMUNITY): Payer: Self-pay

## 2021-12-07 ENCOUNTER — Other Ambulatory Visit (HOSPITAL_COMMUNITY): Payer: Self-pay

## 2021-12-15 ENCOUNTER — Other Ambulatory Visit (HOSPITAL_COMMUNITY): Payer: Self-pay

## 2021-12-15 MED ORDER — VALACYCLOVIR HCL 1 G PO TABS
ORAL_TABLET | ORAL | 2 refills | Status: DC
  Filled 2021-12-15: qty 21, 7d supply, fill #0

## 2021-12-28 ENCOUNTER — Other Ambulatory Visit (HOSPITAL_COMMUNITY): Payer: Self-pay

## 2022-01-11 ENCOUNTER — Other Ambulatory Visit: Payer: Self-pay | Admitting: Obstetrics and Gynecology

## 2022-01-11 DIAGNOSIS — Z1231 Encounter for screening mammogram for malignant neoplasm of breast: Secondary | ICD-10-CM

## 2022-01-18 ENCOUNTER — Other Ambulatory Visit (HOSPITAL_COMMUNITY): Payer: Self-pay

## 2022-01-18 MED ORDER — AMOXICILLIN 875 MG PO TABS
ORAL_TABLET | ORAL | 0 refills | Status: DC
  Filled 2022-01-18: qty 20, 10d supply, fill #0

## 2022-02-06 ENCOUNTER — Other Ambulatory Visit (HOSPITAL_COMMUNITY): Payer: Self-pay

## 2022-02-10 ENCOUNTER — Other Ambulatory Visit (HOSPITAL_COMMUNITY): Payer: Self-pay

## 2022-02-16 ENCOUNTER — Other Ambulatory Visit (HOSPITAL_COMMUNITY): Payer: Self-pay

## 2022-02-20 ENCOUNTER — Ambulatory Visit
Admission: RE | Admit: 2022-02-20 | Discharge: 2022-02-20 | Disposition: A | Discharge status: Home / Self Care | Payer: No Typology Code available for payment source | Source: Ambulatory Visit | Attending: Obstetrics and Gynecology | Admitting: Obstetrics and Gynecology

## 2022-02-20 DIAGNOSIS — Z1231 Encounter for screening mammogram for malignant neoplasm of breast: Secondary | ICD-10-CM

## 2022-02-27 ENCOUNTER — Other Ambulatory Visit (HOSPITAL_COMMUNITY): Payer: Self-pay

## 2022-03-01 ENCOUNTER — Other Ambulatory Visit (HOSPITAL_COMMUNITY): Payer: Self-pay

## 2022-03-19 ENCOUNTER — Other Ambulatory Visit (HOSPITAL_COMMUNITY): Payer: Self-pay

## 2022-03-20 ENCOUNTER — Other Ambulatory Visit (HOSPITAL_COMMUNITY): Payer: Self-pay

## 2022-03-20 MED ORDER — HYDROXYCHLOROQUINE SULFATE 200 MG PO TABS
ORAL_TABLET | ORAL | 0 refills | Status: DC
  Filled 2022-03-20: qty 180, 90d supply, fill #0

## 2022-03-21 ENCOUNTER — Other Ambulatory Visit (HOSPITAL_COMMUNITY): Payer: Self-pay

## 2022-03-21 MED ORDER — AMPHETAMINE-DEXTROAMPHETAMINE 30 MG PO TABS
30.0000 mg | ORAL_TABLET | Freq: Two times a day (BID) | ORAL | 0 refills | Status: DC
  Filled 2022-03-21: qty 40, 20d supply, fill #0
  Filled 2022-03-21: qty 20, 10d supply, fill #0

## 2022-03-22 ENCOUNTER — Other Ambulatory Visit (HOSPITAL_COMMUNITY): Payer: Self-pay

## 2022-04-05 ENCOUNTER — Other Ambulatory Visit (HOSPITAL_COMMUNITY): Payer: Self-pay

## 2022-04-05 MED ORDER — ALBUTEROL SULFATE HFA 108 (90 BASE) MCG/ACT IN AERS
INHALATION_SPRAY | RESPIRATORY_TRACT | 1 refills | Status: DC
  Filled 2022-04-05: qty 18, 16d supply, fill #0
  Filled 2022-06-19: qty 6.7, 16d supply, fill #1

## 2022-04-12 ENCOUNTER — Other Ambulatory Visit (HOSPITAL_COMMUNITY): Payer: Self-pay

## 2022-04-12 MED ORDER — SERTRALINE HCL 25 MG PO TABS
ORAL_TABLET | ORAL | 11 refills | Status: DC
  Filled 2022-04-12: qty 30, 30d supply, fill #0
  Filled 2022-05-02: qty 30, 30d supply, fill #1

## 2022-04-12 MED ORDER — SERTRALINE HCL 50 MG PO TABS
ORAL_TABLET | ORAL | 12 refills | Status: DC
  Filled 2022-04-12: qty 30, 30d supply, fill #0
  Filled 2022-05-02: qty 30, 30d supply, fill #1

## 2022-04-14 ENCOUNTER — Other Ambulatory Visit (HOSPITAL_COMMUNITY): Payer: Self-pay

## 2022-04-17 ENCOUNTER — Other Ambulatory Visit (HOSPITAL_COMMUNITY): Payer: Self-pay

## 2022-04-17 MED ORDER — CLONAZEPAM 0.5 MG PO TABS
ORAL_TABLET | ORAL | 4 refills | Status: DC
Start: 2022-04-17 — End: 2022-12-13
  Filled 2022-04-17: qty 30, 30d supply, fill #0

## 2022-05-02 ENCOUNTER — Other Ambulatory Visit (HOSPITAL_COMMUNITY): Payer: Self-pay

## 2022-05-03 ENCOUNTER — Other Ambulatory Visit (HOSPITAL_COMMUNITY): Payer: Self-pay

## 2022-05-04 ENCOUNTER — Other Ambulatory Visit (HOSPITAL_COMMUNITY): Payer: Self-pay

## 2022-05-04 MED ORDER — AMPHETAMINE-DEXTROAMPHETAMINE 30 MG PO TABS
ORAL_TABLET | ORAL | 0 refills | Status: DC
  Filled 2022-05-04: qty 60, 30d supply, fill #0

## 2022-05-05 ENCOUNTER — Other Ambulatory Visit (HOSPITAL_COMMUNITY): Payer: Self-pay

## 2022-05-06 ENCOUNTER — Other Ambulatory Visit (HOSPITAL_COMMUNITY): Payer: Self-pay

## 2022-06-12 ENCOUNTER — Other Ambulatory Visit (HOSPITAL_COMMUNITY): Payer: Self-pay

## 2022-06-14 ENCOUNTER — Other Ambulatory Visit (HOSPITAL_COMMUNITY): Payer: Self-pay

## 2022-06-14 MED ORDER — IBUPROFEN 800 MG PO TABS
800.0000 mg | ORAL_TABLET | Freq: Three times a day (TID) | ORAL | 3 refills | Status: DC
  Filled 2022-06-14: qty 270, 90d supply, fill #0
  Filled 2022-09-23: qty 270, 90d supply, fill #1
  Filled 2022-12-18: qty 270, 90d supply, fill #2
  Filled 2023-03-06: qty 270, 90d supply, fill #3

## 2022-06-14 MED ORDER — AMPHETAMINE-DEXTROAMPHETAMINE 30 MG PO TABS
30.0000 mg | ORAL_TABLET | Freq: Two times a day (BID) | ORAL | 0 refills | Status: DC
  Filled 2022-11-02 – 2022-11-09 (×2): qty 60, 30d supply, fill #0

## 2022-06-14 MED ORDER — AMPHETAMINE-DEXTROAMPHETAMINE 30 MG PO TABS
30.0000 mg | ORAL_TABLET | Freq: Two times a day (BID) | ORAL | 0 refills | Status: DC
  Filled 2022-06-14: qty 60, 30d supply, fill #0

## 2022-06-14 MED ORDER — AMPHETAMINE-DEXTROAMPHETAMINE 30 MG PO TABS
30.0000 mg | ORAL_TABLET | Freq: Two times a day (BID) | ORAL | 0 refills | Status: DC
  Filled 2022-08-10: qty 60, 30d supply, fill #0

## 2022-06-14 MED ORDER — AZITHROMYCIN 250 MG PO TABS
ORAL_TABLET | ORAL | 0 refills | Status: DC
Start: 2022-06-14 — End: 2022-11-08
  Filled 2022-06-14: qty 6, 5d supply, fill #0

## 2022-06-19 ENCOUNTER — Other Ambulatory Visit (HOSPITAL_COMMUNITY): Payer: Self-pay

## 2022-06-19 MED ORDER — HYDROXYCHLOROQUINE SULFATE 200 MG PO TABS
ORAL_TABLET | ORAL | 0 refills | Status: DC
  Filled 2022-06-19: qty 180, 90d supply, fill #0

## 2022-07-05 ENCOUNTER — Other Ambulatory Visit (HOSPITAL_COMMUNITY): Payer: Self-pay

## 2022-07-05 MED ORDER — FLUCONAZOLE 150 MG PO TABS
150.0000 mg | ORAL_TABLET | ORAL | 0 refills | Status: AC
  Filled 2022-07-05: qty 2, 7d supply, fill #0

## 2022-07-31 ENCOUNTER — Other Ambulatory Visit (HOSPITAL_COMMUNITY): Payer: Self-pay

## 2022-08-01 ENCOUNTER — Other Ambulatory Visit (HOSPITAL_COMMUNITY): Payer: Self-pay

## 2022-08-01 MED ORDER — SERTRALINE HCL 100 MG PO TABS
150.0000 mg | ORAL_TABLET | Freq: Every day | ORAL | 5 refills | Status: DC
Start: 2022-07-31 — End: 2023-03-14
  Filled 2022-12-05: qty 45, 30d supply, fill #0

## 2022-08-01 MED ORDER — HYDROXYCHLOROQUINE SULFATE 200 MG PO TABS
400.0000 mg | ORAL_TABLET | Freq: Every day | ORAL | 5 refills | Status: DC
  Filled 2022-08-01: qty 60, 30d supply, fill #0

## 2022-08-10 ENCOUNTER — Other Ambulatory Visit (HOSPITAL_COMMUNITY): Payer: Self-pay

## 2022-08-25 ENCOUNTER — Other Ambulatory Visit (HOSPITAL_COMMUNITY): Payer: Self-pay

## 2022-09-13 ENCOUNTER — Other Ambulatory Visit (HOSPITAL_COMMUNITY): Payer: Self-pay

## 2022-09-13 MED ORDER — HYDROXYCHLOROQUINE SULFATE 200 MG PO TABS
400.0000 mg | ORAL_TABLET | Freq: Every day | ORAL | 1 refills | Status: DC
  Filled 2022-09-13: qty 180, 90d supply, fill #0

## 2022-09-13 MED ORDER — SERTRALINE HCL 100 MG PO TABS
150.0000 mg | ORAL_TABLET | Freq: Every day | ORAL | 1 refills | Status: DC
  Filled 2022-09-13: qty 135, 90d supply, fill #0

## 2022-09-23 ENCOUNTER — Other Ambulatory Visit (HOSPITAL_COMMUNITY): Payer: Self-pay

## 2022-09-26 ENCOUNTER — Other Ambulatory Visit (HOSPITAL_COMMUNITY): Payer: Self-pay

## 2022-09-26 MED ORDER — AMPHETAMINE-DEXTROAMPHETAMINE 30 MG PO TABS
30.0000 mg | ORAL_TABLET | Freq: Two times a day (BID) | ORAL | 0 refills | Status: DC
  Filled 2022-09-26: qty 60, 30d supply, fill #0

## 2022-10-02 ENCOUNTER — Other Ambulatory Visit: Payer: Self-pay

## 2022-11-02 ENCOUNTER — Other Ambulatory Visit (HOSPITAL_COMMUNITY): Payer: Self-pay

## 2022-11-03 ENCOUNTER — Other Ambulatory Visit: Payer: Self-pay

## 2022-11-03 ENCOUNTER — Other Ambulatory Visit (HOSPITAL_COMMUNITY): Payer: Self-pay

## 2022-11-03 MED ORDER — AMPHETAMINE-DEXTROAMPHETAMINE 30 MG PO TABS
30.0000 mg | ORAL_TABLET | Freq: Two times a day (BID) | ORAL | 0 refills | Status: DC
  Filled 2022-11-03 – 2022-12-18 (×3): qty 60, 30d supply, fill #0

## 2022-11-06 ENCOUNTER — Other Ambulatory Visit (HOSPITAL_COMMUNITY): Payer: Self-pay

## 2022-11-07 ENCOUNTER — Other Ambulatory Visit: Payer: Self-pay

## 2022-11-07 ENCOUNTER — Other Ambulatory Visit (HOSPITAL_COMMUNITY): Payer: Self-pay

## 2022-11-07 NOTE — Progress Notes (Signed)
Office Visit Note  Patient: Megan Tanner             Date of Birth: 10/25/1966           MRN: [id number]             PCP: Shirline Frees, MD Referring: Shirline Frees, MD Visit Date: 11/08/2022 Occupation: Pediatric nurse  Subjective:  Other (Transfer of care. Previously seeing Dr. Dossie Der)   History of Present Illness: Megan Tanner is a 57 y.o. female here for second opinion for psoriatic arthritis. She was previously seeing Dr. Dossie Der for management including plaquenil 400 mg daily. She has taken multiple previous treatments most recently Orencia with incomplete symptom improvement.  She is also using ibuprofen 800 mg as needed for breakthrough inflammatory and osteoarthritis pain.  She has tried several longer acting or prescription or topical NSAIDs all of which were not as effective as the high-dose ibuprofen. She has been having at least 5 or 6 years of persistent pain affecting her hands with worsening bony nodules with episodic redness swelling and nail changes. She has fingernail ridges with some nail discoloration and separation in the great toe.  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  So far oral and topical medications have not improved these rashes.  They are not painful or itchy. She has had a chronic injury to the right knee that occurred falling down stairs and sees Dr. French Ana for long-term management has had MRIs but trying to avoid surgery.  Also has received previous viscosupplementation injection series. She did not have great benefit from these. Steroid shots have been helpful for at least a month or more but not especially long either.   DMARD Hx Methotrexate - Rashes Leflunomide - Intolerance Humira - Incomplete response Remicade - Incomplete response Xeljanz - Nonresponder Cosentyx - Nonresponder Orencia - Nonresponder   Activities of  Daily Living:  Patient reports morning stiffness for 1-2 hour.   Patient Reports nocturnal pain.  Difficulty dressing/grooming: Denies Difficulty climbing stairs: Reports Difficulty getting out of chair: Denies Difficulty using hands for taps, buttons, cutlery, and/or writing: Reports  Review of Systems  Constitutional:  Positive for fatigue.  HENT:  Positive for mouth dryness. Negative for mouth sores.   Eyes:  Positive for dryness.  Respiratory:  Negative for shortness of breath.   Cardiovascular:  Negative for chest pain and palpitations.  Gastrointestinal:  Positive for constipation. Negative for blood in stool and diarrhea.  Endocrine: Negative for increased urination.  Genitourinary:  Negative for involuntary urination.  Musculoskeletal:  Positive for joint pain, joint pain, joint swelling, myalgias, morning stiffness, muscle tenderness and myalgias. Negative for gait problem and muscle weakness.  Skin:  Positive for rash and hair loss. Negative for color change and sensitivity to sunlight.  Allergic/Immunologic: Negative for susceptible to infections.  Neurological:  Positive for dizziness and headaches.  Hematological:  Negative for swollen glands.  Psychiatric/Behavioral:  Positive for depressed mood and sleep disturbance. The patient is nervous/anxious.     PMFS History:  Patient Active Problem List   Diagnosis Date Noted   Chronic fatigue 07/06/2020   Fibromyalgia affecting multiple sites 07/06/2020   Hypertension 07/06/2020   IBS (irritable bowel syndrome) 07/06/2020   Migraines 07/06/2020   Osteoarthritis 07/06/2020   Psoriatic arthritis ([place]) 07/06/2020   Restless leg syndrome 07/06/2020    Past Medical History:  Diagnosis Date  Allergy    Arthritis    Dry eyes    Dry mouth    Fatigue    Fibromyalgia    Hypertension    IBS (irritable bowel syndrome)    Migraines     Family History  Problem Relation Age of Onset   Osteoarthritis Mother    Hypertension  Mother    Breast cancer Mother    Macular degeneration Mother    Heart disease Mother    COPD Mother    Glaucoma Mother    Osteoarthritis Father    Glaucoma Father    Hypertension Father    Stroke Father    Parkinson's disease Father    Dementia Father    Colon cancer Neg Hx    Colon polyps Neg Hx    Esophageal cancer Neg Hx    Rectal cancer Neg Hx    Stomach cancer Neg Hx    Past Surgical History:  Procedure Laterality Date   ABDOMINAL HYSTERECTOMY     c sections     1990 1993   KNEE SURGERY     KNEE SURGERY Right     has had 6 knee surgery   SHOULDER SURGERY     Social History   Social History Narrative   Not on file   Immunization History  Administered Date(s) Administered   PFIZER(Purple Top)SARS-COV-2 Vaccination 10/28/2019, 11/24/2019     Objective: Vital Signs: BP 124/84 (BP Location: Left Arm, Patient Position: Sitting, Cuff Size: Normal)   Pulse (!) 101   Resp 12   Ht 4' 11.75" (1.518 m)   Wt 154 lb (69.9 kg)   BMI 30.33 kg/m    Physical Exam Eyes:     Conjunctiva/sclera: Conjunctivae normal.  Cardiovascular:     Rate and Rhythm: Normal rate and regular rhythm.  Pulmonary:     Effort: Pulmonary effort is normal.     Breath sounds: Normal breath sounds.  Lymphadenopathy:     Cervical: No cervical adenopathy.  Skin:    General: Skin is warm and dry.     Findings: Rash present.     Comments: Faint erythematous rashes on elbow and hip crease surfaces, no palpules or macules, no tenderness  Neurological:     Mental Status: She is alert.  Psychiatric:        Mood and Affect: Mood normal.      Musculoskeletal Exam:  Shoulders full ROM no tenderness or swelling some stiffness abducting overhead Elbows full ROM no tenderness or swelling Wrists full ROM no tenderness or swelling Fingers full ROM right thumb mild 1st MCP subluxation and hyperextension, DIP joint heberdon's nodes throughout both hands with mildly reduced ROM no tenderness or  swelling Hip normal internal and external rotation without pain, lateral hip tenderness to pressure on both sides, and worse over gluteal muscles especially on right side Knees full ROM nright knee medial joint line tenderness to pressure, none with varus or valgus pressure Ankles full ROM no tenderness or swelling Right 1st MTP tenderness no palpable synovitis   CDAI Exam: CDAI Score: 8  Patient Global: 40 mm; Provider Global: 20 mm Swollen: 0 ; Tender: 4  Joint Exam 11/08/2022      Right  Left  CMC   Tender   Tender  MCP 1   Tender     Knee   Tender        Investigation: No additional findings.  Imaging: No results found.  Recent Labs: No results found for: "WBC", "HGB", "PLT", "NA", "  K", "CL", "CO2", "GLUCOSE", "BUN", "CREATININE", "BILITOT", "ALKPHOS", "AST", "ALT", "PROT", "ALBUMIN", "CALCIUM", "GFRAA", "QFTBGOLD", "QFTBGOLDPLUS"  Speciality Comments: No specialty comments available.  Procedures:  Large Joint Inj: R knee on 11/08/2022 9:28 AM Indications: pain Details: 25 G 1.5 in needle, anterior approach Medications: 3 mL lidocaine 1 %; 40 mg triamcinolone acetonide 40 MG/ML Outcome: tolerated well, no immediate complications Procedure, treatment alternatives, risks and benefits explained, specific risks discussed. Consent was given by the patient. Immediately prior to procedure a time out was called to verify the correct patient, procedure, equipment, support staff and site/side marked as required. Patient was prepped and draped in the usual sterile fashion.     Allergies: Codeine, Erythromycin, Methotrexate, Sulfa antibiotics, Tramadol, and Leflunomide   Assessment / Plan:     Visit Diagnoses: Psoriatic arthritis ([place])  Rheumatoid arthritis, involving unspecified site, unspecified whether rheumatoid factor present ([place]) - Plan: XR Hand 2 View Right, XR Hand 2 View Left, Sedimentation rate, C-reactive protein  History of inflammatory arthritis.  There is been  somewhat unclear clinical picture for psoriatic disease versus rheumatoid arthritis.  Will check serum antibody markers including ANA rheumatoid factor and CCP today.  Also checking sed rate and CRP for systemic inflammation monitoring.  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  Continue hydroxychloroquine 400 mg daily and the ibuprofen 800 mg as needed at this time.  She has been tried on quite a few biologic drugs without a huge success so far, novel mechanism of action options could consider IL-6 or mild 23 targeting medications.  If seropositive for rheumatoid arthritis would also consider rituximab as an option.  Fibromyalgia affecting multiple sites  Somewhat diffuse joint pains but also has underlying structural changes most of the bothersome areas.  She is on sertraline 150 mg daily.  No medication specific for this currently.  Primary osteoarthritis involving multiple joints  Other secondary osteoarthritis of right knee - Plan: Large Joint Inj: R knee  Osteoarthritis in multiple joints but knee pain is a known problem suspect pain here is more from structural disease than active joint inflammation right now.  She previously had a good benefit with intra-articular steroid injections we will repeat this with right knee injection today.  High risk medication use - Plan: CBC with Differential/Platelet, COMPLETE METABOLIC PANEL WITH GFR, QuantiFERON-TB Gold Plus, Lipid panel  Considering additional biologic DMARD treatment options checking baseline CBC and CMP updating QuantiFERON screening and getting a lipid panel today.  Also reviewed hydroxychloroquine need for at least annual retinal toxicity screening if continuing long-term.  Orders: Orders Placed This Encounter  Procedures   Large Joint Inj: R knee   XR Hand 2 View Right   XR Hand 2 View Left    Sedimentation rate   C-reactive protein   Rheumatoid factor   Cyclic citrul peptide antibody, IgG   ANA   CBC with Differential/Platelet   COMPLETE METABOLIC PANEL WITH GFR   QuantiFERON-TB Gold Plus   Lipid panel   Meds ordered this encounter  Medications   hydroxychloroquine (PLAQUENIL) 200 MG tablet    Sig: Take 2 tablets (400 mg total) by mouth daily with food or milk.    Dispense:  180 tablet    Refill:  1    Sending new Rx to update new provider office for future refills     Follow-Up Instructions: Return in about 4 weeks (around 12/06/2022) for  New pt ?PsA/RA/OA/FMS on HCQ f/u 4wks.   Collier Salina, MD  Note - This record has been created using [employer].  Chart creation errors have been sought, but may not always  have been located. Such creation errors do not reflect on  the standard of medical care.

## 2022-11-08 ENCOUNTER — Ambulatory Visit (INDEPENDENT_AMBULATORY_CARE_PROVIDER_SITE_OTHER): Payer: 59

## 2022-11-08 ENCOUNTER — Ambulatory Visit: Payer: 59

## 2022-11-08 ENCOUNTER — Ambulatory Visit: Payer: 59 | Attending: Internal Medicine | Admitting: Internal Medicine

## 2022-11-08 ENCOUNTER — Other Ambulatory Visit (HOSPITAL_COMMUNITY): Payer: Self-pay

## 2022-11-08 ENCOUNTER — Encounter: Payer: Self-pay | Admitting: Internal Medicine

## 2022-11-08 ENCOUNTER — Other Ambulatory Visit: Payer: Self-pay

## 2022-11-08 VITALS — BP 124/84 | HR 101 | Resp 12 | Ht 59.75 in | Wt 154.0 lb

## 2022-11-08 DIAGNOSIS — M159 Polyosteoarthritis, unspecified: Secondary | ICD-10-CM | POA: Diagnosis not present

## 2022-11-08 DIAGNOSIS — M79641 Pain in right hand: Secondary | ICD-10-CM | POA: Diagnosis not present

## 2022-11-08 DIAGNOSIS — M069 Rheumatoid arthritis, unspecified: Secondary | ICD-10-CM | POA: Diagnosis not present

## 2022-11-08 DIAGNOSIS — Z79899 Other long term (current) drug therapy: Secondary | ICD-10-CM

## 2022-11-08 DIAGNOSIS — L4050 Arthropathic psoriasis, unspecified: Secondary | ICD-10-CM

## 2022-11-08 DIAGNOSIS — M797 Fibromyalgia: Secondary | ICD-10-CM | POA: Diagnosis not present

## 2022-11-08 DIAGNOSIS — M25561 Pain in right knee: Secondary | ICD-10-CM | POA: Diagnosis not present

## 2022-11-08 DIAGNOSIS — M175 Other unilateral secondary osteoarthritis of knee: Secondary | ICD-10-CM | POA: Diagnosis not present

## 2022-11-08 DIAGNOSIS — M79642 Pain in left hand: Secondary | ICD-10-CM

## 2022-11-08 MED ORDER — HYDROXYCHLOROQUINE SULFATE 200 MG PO TABS
400.0000 mg | ORAL_TABLET | Freq: Every day | ORAL | 1 refills | Status: DC
  Filled 2022-11-08 – 2022-12-18 (×3): qty 180, 90d supply, fill #0
  Filled 2023-03-19: qty 180, 90d supply, fill #1

## 2022-11-08 NOTE — Patient Instructions (Signed)
Hydroxychloroquine Tablets What is this medication? HYDROXYCHLOROQUINE (hye drox ee KLOR oh kwin) treats autoimmune conditions, such as rheumatoid arthritis and lupus. It works by slowing down an overactive immune system. It may also be used to prevent and treat malaria. It works by killing the parasite that causes malaria. It belongs to a group of medications called DMARDs. This medicine may be used for other purposes; ask your health care provider or pharmacist if you have questions. COMMON BRAND NAME(S): Plaquenil, Quineprox What should I tell my care team before I take this medication? They need to know if you have any of these conditions: Diabetes Eye disease, vision problems Frequently drink alcohol G6PD deficiency Heart disease Irregular heartbeat or rhythm Kidney disease Liver disease Porphyria Psoriasis An unusual or allergic reaction to hydroxychloroquine, other medications, foods, dyes, or preservatives Pregnant or trying to get pregnant Breastfeeding How should I use this medication? Take this medication by mouth with water. Take it as directed on the prescription label. Do not cut, crush, or chew this medication. Swallow the tablets whole. Take it with food. Do not take it more than directed. Take all of this medication unless your care team tells you to stop it early. Keep taking it even if you think you are better. Take products with antacids in them at a different time of day than this medication. Take this medication 4 hours before or 4 hours after antacids. Talk to your care team if you have questions. Talk to your care team about the use of this medication in children. While this medication may be prescribed for selected conditions, precautions do apply. Overdosage: If you think you have taken too much of this medicine contact a poison control center or emergency room at once. NOTE: This medicine is only for you. Do not share this medicine with others. What if I miss a  dose? If you miss a dose, take it as soon as you can. If it is almost time for your next dose, take only that dose. Do not take double or extra doses. What may interact with this medication? Do not take this medication with any of the following: Cisapride Dronedarone Pimozide Thioridazine This medication may also interact with the following: Ampicillin Antacids Cimetidine Cyclosporine Digoxin Kaolin Medications for diabetes, such as insulin, glipizide, glyburide Medications for seizures, such as carbamazepine, phenobarbital, phenytoin Mefloquine Methotrexate Other medications that cause heart rhythm changes Praziquantel This list may not describe all possible interactions. Give your health care provider a list of all the medicines, herbs, non-prescription drugs, or dietary supplements you use. Also tell them if you smoke, drink alcohol, or use illegal drugs. Some items may interact with your medicine. What should I watch for while using this medication? Visit your care team for regular checks on your progress. Tell your care team if your symptoms do not start to get better or if they get worse. You may need blood work done while you are taking this medication. If you take other medications that can affect heart rhythm, you may need more testing. Talk to your care team if you have questions. Your vision may be tested before and during use of this medication. Tell your care team right away if you have any change in your eyesight. This medication may cause serious skin reactions. They can happen weeks to months after starting the medication. Contact your care team right away if you notice fevers or flu-like symptoms with a rash. The rash may be red or purple and then turn   into blisters or peeling of the skin. Or, you might notice a red rash with swelling of the face, lips or lymph nodes in your neck or under your arms. If you or your family notice any changes in your behavior, such as new or  worsening depression, thoughts of harming yourself, anxiety, or other unusual or disturbing thoughts, or memory loss, call your care team right away. What side effects may I notice from receiving this medication? Side effects that you should report to your care team as soon as possible: Allergic reactions--skin rash, itching, hives, swelling of the face, lips, tongue, or throat Aplastic anemia--unusual weakness or fatigue, dizziness, headache, trouble breathing, increased bleeding or bruising Change in vision Heart rhythm changes--fast or irregular heartbeat, dizziness, feeling faint or lightheaded, chest pain, trouble breathing Infection--fever, chills, cough, or sore throat Low blood sugar (hypoglycemia)--tremors or shaking, anxiety, sweating, cold or clammy skin, confusion, dizziness, rapid heartbeat Muscle injury--unusual weakness or fatigue, muscle pain, dark yellow or brown urine, decrease in amount of urine Pain, tingling, or numbness in the hands or feet Rash, fever, and swollen lymph nodes Redness, blistering, peeling, or loosening of the skin, including inside the mouth Thoughts of suicide or self-harm, worsening mood, or feelings of depression Unusual bruising or bleeding Side effects that usually do not require medical attention (report to your care team if they continue or are bothersome): Diarrhea Headache Nausea Stomach pain Vomiting This list may not describe all possible side effects. Call your doctor for medical advice about side effects. You may report side effects to FDA at 1-800-FDA-1088. Where should I keep my medication? Keep out of the reach of children and pets. Store at room temperature up to 30 degrees C (86 degrees F). Protect from light. Get rid of any unused medication after the expiration date. To get rid of medications that are no longer needed or have expired: Take the medication to a medication take-back program. Check with your pharmacy or law enforcement  to find a location. If you cannot return the medication, check the label or package insert to see if the medication should be thrown out in the garbage or flushed down the toilet. If you are not sure, ask your care team. If it is safe to put it in the trash, empty the medication out of the container. Mix the medication with cat litter, dirt, coffee grounds, or other unwanted substance. Seal the mixture in a bag or container. Put it in the trash. NOTE: This sheet is a summary. It may not cover all possible information. If you have questions about this medicine, talk to your doctor, pharmacist, or health care provider.  2023 Elsevier/Gold Standard (2004-12-27 00:00:00)  

## 2022-11-10 ENCOUNTER — Encounter (HOSPITAL_COMMUNITY): Payer: Self-pay

## 2022-11-10 ENCOUNTER — Other Ambulatory Visit (HOSPITAL_COMMUNITY): Payer: Self-pay

## 2022-11-10 ENCOUNTER — Other Ambulatory Visit: Payer: Self-pay

## 2022-11-10 LAB — COMPLETE METABOLIC PANEL WITH GFR
AG Ratio: 1.8 (calc) (ref 1.0–2.5)
ALT: 16 U/L (ref 6–29)
AST: 16 U/L (ref 10–35)
Albumin: 4.6 g/dL (ref 3.6–5.1)
Alkaline phosphatase (APISO): 57 U/L (ref 37–153)
BUN: 20 mg/dL (ref 7–25)
CO2: 30 mmol/L (ref 20–32)
Calcium: 10.1 mg/dL (ref 8.6–10.4)
Chloride: 103 mmol/L (ref 98–110)
Creat: 0.81 mg/dL (ref 0.50–1.03)
Globulin: 2.6 g/dL (calc) (ref 1.9–3.7)
Glucose, Bld: 113 mg/dL — ABNORMAL HIGH (ref 65–99)
Potassium: 4.7 mmol/L (ref 3.5–5.3)
Sodium: 142 mmol/L (ref 135–146)
Total Bilirubin: 0.3 mg/dL (ref 0.2–1.2)
Total Protein: 7.2 g/dL (ref 6.1–8.1)
eGFR: 85 mL/min/{1.73_m2} (ref >=60)

## 2022-11-10 LAB — RHEUMATOID FACTOR: Rheumatoid fact SerPl-aCnc: <14 IU/mL (ref <14)

## 2022-11-10 LAB — LIPID PANEL
Cholesterol: 219 mg/dL — ABNORMAL HIGH (ref <200)
HDL: 68 mg/dL (ref >=50)
LDL Cholesterol (Calc): 130 mg/dL (calc) — ABNORMAL HIGH
Non-HDL Cholesterol (Calc): 151 mg/dL (calc) — ABNORMAL HIGH (ref <130)
Total CHOL/HDL Ratio: 3.2 (calc) (ref <5.0)
Triglycerides: 105 mg/dL (ref <150)

## 2022-11-10 LAB — CBC WITH DIFFERENTIAL/PLATELET
Absolute Monocytes: 412 cells/uL (ref 200–950)
Basophils Absolute: 99 cells/uL (ref 0–200)
Basophils Relative: 1.4 %
Eosinophils Absolute: 469 cells/uL (ref 15–500)
Eosinophils Relative: 6.6 %
HCT: 44.6 % (ref 35.0–45.0)
Hemoglobin: 15.7 g/dL — ABNORMAL HIGH (ref 11.7–15.5)
Lymphs Abs: 2059 cells/uL (ref 850–3900)
MCH: 31.7 pg (ref 27.0–33.0)
MCHC: 35.2 g/dL (ref 32.0–36.0)
MCV: 90.1 fL (ref 80.0–100.0)
MPV: 9.9 fL (ref 7.5–12.5)
Monocytes Relative: 5.8 %
Neutro Abs: 4061 cells/uL (ref 1500–7800)
Neutrophils Relative %: 57.2 %
Platelets: 333 10*3/uL (ref 140–400)
RBC: 4.95 10*6/uL (ref 3.80–5.10)
RDW: 11.7 % (ref 11.0–15.0)
Total Lymphocyte: 29 %
WBC: 7.1 10*3/uL (ref 3.8–10.8)

## 2022-11-10 LAB — CYCLIC CITRUL PEPTIDE ANTIBODY, IGG: Cyclic Citrullin Peptide Ab: <16 UNITS

## 2022-11-10 LAB — QUANTIFERON-TB GOLD PLUS
Mitogen-NIL: >10 IU/mL
NIL: 0.03 IU/mL
QuantiFERON-TB Gold Plus: NEGATIVE
TB1-NIL: 0 IU/mL
TB2-NIL: 0 IU/mL

## 2022-11-10 LAB — ANA: Anti Nuclear Antibody (ANA): NEGATIVE

## 2022-11-10 LAB — C-REACTIVE PROTEIN: CRP: 1.6 mg/L (ref <8.0)

## 2022-11-10 LAB — SEDIMENTATION RATE: Sed Rate: 6 mm/h (ref 0–30)

## 2022-11-13 ENCOUNTER — Other Ambulatory Visit (HOSPITAL_COMMUNITY): Payer: Self-pay

## 2022-11-13 NOTE — Progress Notes (Signed)
Lab results look fine for continuing the hydroxychloroquine currently. Sedimentation rate and CRP are normal which is good. Her xrays show damage that looks most like erosions from psoriatic arthritis, some areas older damage than others.

## 2022-11-15 ENCOUNTER — Other Ambulatory Visit (HOSPITAL_COMMUNITY): Payer: Self-pay

## 2022-11-15 ENCOUNTER — Other Ambulatory Visit: Payer: Self-pay

## 2022-11-15 MED ORDER — FLUCONAZOLE 150 MG PO TABS
ORAL_TABLET | ORAL | 0 refills | Status: DC
  Filled 2022-11-15: qty 2, 7d supply, fill #0

## 2022-11-20 ENCOUNTER — Encounter: Payer: Self-pay | Admitting: Internal Medicine

## 2022-11-25 MED ORDER — TRIAMCINOLONE ACETONIDE 40 MG/ML IJ SUSP
40.0000 mg | INTRAMUSCULAR | Status: AC | PRN
  Administered 2022-11-08: 40 mg via INTRA_ARTICULAR

## 2022-11-25 MED ORDER — LIDOCAINE HCL 1 % IJ SOLN
3.0000 mL | INTRAMUSCULAR | Status: AC | PRN
  Administered 2022-11-08: 3 mL

## 2022-12-06 ENCOUNTER — Other Ambulatory Visit (HOSPITAL_COMMUNITY): Payer: Self-pay

## 2022-12-06 ENCOUNTER — Other Ambulatory Visit: Payer: Self-pay

## 2022-12-10 ENCOUNTER — Other Ambulatory Visit: Payer: Self-pay

## 2022-12-13 ENCOUNTER — Encounter: Payer: Self-pay | Admitting: Internal Medicine

## 2022-12-13 ENCOUNTER — Ambulatory Visit: Payer: 59 | Attending: Internal Medicine | Admitting: Internal Medicine

## 2022-12-13 ENCOUNTER — Telehealth: Payer: Self-pay | Admitting: Pharmacist

## 2022-12-13 VITALS — BP 125/85 | HR 88 | Resp 14 | Ht 60.0 in | Wt 153.0 lb

## 2022-12-13 DIAGNOSIS — Z79899 Other long term (current) drug therapy: Secondary | ICD-10-CM

## 2022-12-13 DIAGNOSIS — R5382 Chronic fatigue, unspecified: Secondary | ICD-10-CM

## 2022-12-13 DIAGNOSIS — L4050 Arthropathic psoriasis, unspecified: Secondary | ICD-10-CM

## 2022-12-13 DIAGNOSIS — M797 Fibromyalgia: Secondary | ICD-10-CM | POA: Diagnosis not present

## 2022-12-13 NOTE — Telephone Encounter (Signed)
Pending OV note from today, please start TREMFYA BIV.  Dose: 115m SQ at Week 0, Week 4, then every 8 weeks  She will need to be enrolled into copay card once approved.  She is [place]- will have to fill with [facility]  DKnox Saliva PharmD, MPH, BCPS, CPP Clinical Pharmacist (Rheumatology and Pulmonology)

## 2022-12-13 NOTE — Progress Notes (Signed)
Office Visit Note  Patient: Megan Tanner             Date of Birth: 1966-02-24           MRN: [id number]             PCP: Shirline Frees, MD Referring: Shirline Frees, MD Visit Date: 12/13/2022   Subjective:  Follow-up   History of Present Illness: Megan Tanner is a 58 y.o. female here for follow up for inflammatory arthritis on HCQ 400 mg daily and ibuprofen 800 mg PRN. Symptoms remain about th same as last time. Lab testing was all negative except mildly elevated cholesterol. 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   Previous HPI 11/08/22 Megan Tanner is a 57 y.o. female here for second opinion for psoriatic arthritis. She was previously seeing Dr. Dossie Der for management including plaquenil 400 mg daily. She has taken multiple previous treatments most recently Orencia with incomplete symptom improvement.  She is also using ibuprofen 800 mg as needed for breakthrough inflammatory and osteoarthritis pain.  She has tried several longer acting or prescription or topical NSAIDs all of which were not as effective as the high-dose ibuprofen. She has been having at least 5 or 6 years of persistent pain affecting her hands with worsening bony nodules with episodic redness swelling and nail changes. She has fingernail ridges with some nail discoloration and separation in the great toe.  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  So far oral and topical medications have not improved these rashes.  They are not painful or itchy. She has had a chronic injury to the right knee that occurred falling down stairs and sees Dr. French Ana for long-term management has had MRIs but trying to avoid surgery.  Also has received previous viscosupplementation injection series. She did not have great benefit from these. Steroid shots have been helpful  for at least a month or more but not especially long either.    DMARD Hx Methotrexate - Rashes Leflunomide - Intolerance Humira - Incomplete response Remicade - Incomplete response Xeljanz - Nonresponder Cosentyx - Nonresponder Orencia - Nonresponder   Review of Systems  Constitutional:  Positive for fatigue.  HENT:  Positive for mouth sores and mouth dryness.   Eyes:  Positive for dryness.  Respiratory:  Negative for shortness of breath.   Cardiovascular:  Negative for chest pain and palpitations.  Gastrointestinal:  Negative for blood in stool, constipation and diarrhea.  Endocrine: Negative for increased urination.  Genitourinary:  Negative for involuntary urination.  Musculoskeletal:  Positive for joint pain, joint pain, joint swelling, myalgias, morning stiffness, muscle tenderness and myalgias. Negative for gait problem and muscle weakness.  Skin:  Positive for rash and hair loss. Negative for color change and sensitivity to sunlight.  Allergic/Immunologic: Positive for susceptible to infections.  Neurological:  Positive for headaches. Negative for dizziness.  Hematological:  Negative for swollen glands.  Psychiatric/Behavioral:  Positive for depressed mood and sleep disturbance. The patient is not nervous/anxious.     PMFS History:  Patient Active Problem List   Diagnosis Date Noted   High risk medication use 12/13/2022   Chronic fatigue 07/06/2020   Fibromyalgia affecting multiple sites 07/06/2020   Hypertension 07/06/2020   IBS (irritable bowel syndrome) 07/06/2020   Migraines 07/06/2020   Osteoarthritis 07/06/2020   Psoriatic arthritis ([place]) 07/06/2020  Restless leg syndrome 07/06/2020    Past Medical History:  Diagnosis Date   Allergy    Arthritis    Dry eyes    Dry mouth    Fatigue    Fibromyalgia    Hypertension    IBS (irritable bowel syndrome)    Migraines     Family History  Problem Relation Age of Onset   Osteoarthritis Mother    Hypertension  Mother    Breast cancer Mother    Macular degeneration Mother    Heart disease Mother    COPD Mother    Glaucoma Mother    Osteoarthritis Father    Glaucoma Father    Hypertension Father    Stroke Father    Parkinson's disease Father    Dementia Father    Colon cancer Neg Hx    Colon polyps Neg Hx    Esophageal cancer Neg Hx    Rectal cancer Neg Hx    Stomach cancer Neg Hx    Past Surgical History:  Procedure Laterality Date   ABDOMINAL HYSTERECTOMY     c sections     1990 1993   KNEE SURGERY     KNEE SURGERY Right     has had 6 knee surgery   SHOULDER SURGERY     Social History   Social History Narrative   Not on file   Immunization History  Administered Date(s) Administered   PFIZER(Purple Top)SARS-COV-2 Vaccination 10/28/2019, 11/24/2019     Objective: Vital Signs: BP 125/85 (BP Location: Left Arm, Patient Position: Sitting, Cuff Size: Normal)   Pulse 88   Resp 14   Ht 5' (1.524 m)   Wt 153 lb (69.4 kg)   BMI 29.88 kg/m    Physical Exam Cardiovascular:     Rate and Rhythm: Normal rate and regular rhythm.  Pulmonary:     Effort: Pulmonary effort is normal.     Breath sounds: Normal breath sounds.  Skin:    General: Skin is warm and dry.     Findings: Rash present.     Comments: Faintly erythematous skin rash on elbow extensor surfaces and hip creases, no scaling  Neurological:     Mental Status: She is alert.  Psychiatric:        Mood and Affect: Mood normal.      Musculoskeletal Exam:  Elbows full ROM no tenderness or swelling Wrists full ROM no tenderness or swelling Fingers 1st CMC joint squaring with MCP hyperextension, DIP joint nodules throughout both hands without palpable synovitis Lateral hip tenderness, no pain with internal and external rotation Knees full ROM medial joint line tenderness to pressure no effusions   Investigation: No additional findings.  Imaging: No results found.  Recent Labs: Lab Results  Component Value  Date   WBC 7.1 11/08/2022   HGB 15.7 (H) 11/08/2022   PLT 333 11/08/2022   NA 142 11/08/2022   K 4.7 11/08/2022   CL 103 11/08/2022   CO2 30 11/08/2022   GLUCOSE 113 (H) 11/08/2022   BUN 20 11/08/2022   CREATININE 0.81 11/08/2022   BILITOT 0.3 11/08/2022   AST 16 11/08/2022   ALT 16 11/08/2022   PROT 7.2 11/08/2022   CALCIUM 10.1 11/08/2022   QFTBGOLDPLUS NEGATIVE 11/08/2022    Speciality Comments: No specialty comments available.  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  Findings are less consistent for rheumatoid arthritis which was considered a possibility before but would not recommend trying tocilizumab or Kevzara treatment at this time.  She has never been on IL 23 targeting medication so recommend we start on Tremfya.  In the meantime can continue hydroxychloroquine 400 mg daily and ibuprofen as needed.  Fibromyalgia affecting multiple sites Chronic fatigue  Still has ongoing significant fatigue pain is more localized to the involved joints than widespread currently.  Planning to escalate DMARD treatment as above we will see if this improves fatigue.  High risk medication use  Recent labs checked showed normal CBC and CMP and QuantiFERON screening.  Discussed risks of Tremfya medication including injection site reactions or infections.  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  Orders: No orders of the defined types were placed in this encounter.  No orders of the defined types were placed in this encounter.    Follow-Up Instructions: Return in about 3 months (around 03/13/2023) for PsA NSAID/HCQ/GUS start f/u  14mo.   CCollier Salina MD  Note - This record has been created using [employer]  Chart creation errors have been sought, but may not always  have been located. Such creation errors do not reflect on  the standard of medical care.

## 2022-12-13 NOTE — Patient Instructions (Signed)
Guselkumab Injection What is this medication? GUSELKUMAB (goo ZELK ue mab) treats autoimmune conditions, such as arthritis and psoriasis. It works by slowing down an overactive immune system. It is a monoclonal antibody. This medicine may be used for other purposes; ask your health care provider or pharmacist if you have questions. COMMON BRAND NAME(S): Tremfya What should I tell my care team before I take this medication? They need to know if you have any of these conditions: Immune system problems Infection, such as a virus infection, chickenpox, cold sores, herpes, or a history of infections Recently received or scheduled to receive a vaccine Tuberculosis, a positive skin test for tuberculosis, or recent close contact with someone who has tuberculosis An unusual or allergic reaction to guselkumab, other medications, foods, dyes, or preservatives Pregnant or trying to get pregnant Breast-feeding How should I use this medication? This medication is injected under the skin. It is usually given by a care team in a hospital or clinic setting. It may also be given at home. If you get this medication at home, you will be taught how to prepare and give it. Use exactly as directed. Take it as directed on the prescription label. Keep taking it unless your care team tells you to stop. It is important that you put your used needles and syringes in a special sharps container. Do not put them in a trash can. If you do not have a sharps container, call your pharmacist or care team to get one. This medication comes with INSTRUCTIONS FOR USE. Ask your pharmacist for directions on how to use this medication. Read the information carefully. Talk to your pharmacist or care team if you have questions. A special MedGuide will be given to you by the pharmacist with each prescription and refill. Be sure to read this information carefully each time. Talk to your care team about the use of this medication in children.  Special care may be needed. Overdosage: If you think you have taken too much of this medicine contact a poison control center or emergency room at once. NOTE: This medicine is only for you. Do not share this medicine with others. What if I miss a dose? If you get this medication at a hospital or clinic: It is important not to miss your dose. Call your care team if you are unable to keep an appointment. If you give yourself this medication at home: If you miss a dose, take it as soon as you can. Then continue your normal schedule. If it is almost time for your next dose, take only that dose. Do not take double or extra doses. Call your care team with questions. What may interact with this medication? Do not take this medication with any of the following: Live virus vaccines This medication may also interact with the following: Amoxapine Certain medications for depression, anxiety, or mental health conditions, such as amitriptyline, clomipramine, desipramine, doxepin, imipramine, maprotiline, nortriptyline, protriptyline, trimipramine Codeine Inactivated vaccines Methadone Pimozide Thioridazine This list may not describe all possible interactions. Give your health care provider a list of all the medicines, herbs, non-prescription drugs, or dietary supplements you use. Also tell them if you smoke, drink alcohol, or use illegal drugs. Some items may interact with your medicine. What should I watch for while using this medication? Your condition will be monitored carefully while you are receiving this medication. Tell your care team if your symptoms do not start to get better or if they get worse. You will be tested for  tuberculosis (TB) before you start this medication. If your care team prescribes any medication for TB, you should start taking the TB medication before starting this medication. Make sure to finish the full course of TB medication. This medication may increase your risk of getting an  infection. Call your care team for advice if you get a fever, chills, sore throat, or other symptoms of a cold or flu. Do not treat yourself. Try to avoid being around people who are sick. What side effects may I notice from receiving this medication? Side effects that you should report to your care team as soon as possible: Allergic reactions--skin rash, itching, hives, swelling of the face, lips, tongue, or throat Infection--fever, chills, cough, sore throat, wounds that don't heal, pain or trouble when passing urine, general feeling of discomfort or being unwell Side effects that usually do not require medical attention (report to your care team if they continue or are bothersome): Diarrhea Headache Joint pain Pain, redness, or irritation at injection site Runny or stuffy nose Sore throat This list may not describe all possible side effects. Call your doctor for medical advice about side effects. You may report side effects to FDA at 1-800-FDA-1088. Where should I keep my medication? Keep out of the reach of children and pets. Store in the refrigerator. Do not freeze. Keep it in the original container until you are ready to use. Protect from light. Do not shake. Remove the dose from the refrigerator about 30 minutes prior to use. Get rid of any unused medication after the expiration date on the label. To get rid of medications that are no longer needed or have expired: Take the medication to a medication take-back program. Check with your pharmacy or law enforcement to find a location. If you cannot return the medication, ask your pharmacist or care team how to get rid of this medication safely. NOTE: This sheet is a summary. It may not cover all possible information. If you have questions about this medicine, talk to your doctor, pharmacist, or health care provider.  2023 Elsevier/Gold Standard (2021-12-28 00:00:00)

## 2022-12-14 NOTE — Telephone Encounter (Signed)
Submitted a Prior Authorization request to [facility] for [facility] via CoverMyMeds. Will update once we receive a response.  Key: Mahala Menghini, PharmD, MPH, BCPS, CPP Clinical Pharmacist (Rheumatology and Pulmonology)

## 2022-12-18 ENCOUNTER — Other Ambulatory Visit: Payer: Self-pay

## 2022-12-18 ENCOUNTER — Other Ambulatory Visit (HOSPITAL_COMMUNITY): Payer: Self-pay

## 2022-12-18 MED ORDER — TREMFYA 100 MG/ML ~~LOC~~ SOAJ
SUBCUTANEOUS | 1 refills | Status: DC
  Filled 2022-12-18: qty 1, fill #0
  Filled 2023-01-10 – 2023-01-15 (×2): qty 1, 56d supply, fill #0
  Filled 2023-03-06 – 2023-03-08 (×2): qty 1, 56d supply, fill #1

## 2022-12-18 MED ORDER — TREMFYA 100 MG/ML ~~LOC~~ SOAJ
SUBCUTANEOUS | 0 refills | Status: DC
  Filled 2022-12-18: qty 1, fill #0
  Filled 2022-12-22: qty 1, 28d supply, fill #0

## 2022-12-18 NOTE — Telephone Encounter (Signed)
Received notification from [facility] regarding a prior authorization for [facility].  Authorization has been APPROVED from 12/16/2022 to 01/15/2023 (authorization # [id number] for maintenance dose of 2 ml per 28 days).   Authorization has been APPROVED from 01/06/2023 to 06/08/2023 (authorization # [id number] for maintenance dose of 1 ml per 56 days). Approval letter sent to scan center.  Per test claim, copay is $500 for Week 0 dose (copay card will cover this)  Patient must fill through [facility]: [phone]   Attempted to enroll patient into Tremfya copay card but patient appears to have already started enrollment.  Rx has been sent to [facility] for Tremfya. ATC patient but phone went straight to VM. Left VM requesting return Mon-Thurs in AM if possible to discuss. She will need to complete copay card enrollment and provide that to the pharmacy.  Knox Saliva, PharmD, MPH, BCPS, CPP Clinical Pharmacist (Rheumatology and Pulmonology)

## 2022-12-19 ENCOUNTER — Other Ambulatory Visit: Payer: Self-pay

## 2022-12-20 NOTE — Telephone Encounter (Signed)
Left VM with patient on work # [phone]). Per patinet's VM box, she does not work on Wednesdays. Left VM advising her to complete copay card enrollment for Tremfya and then call [facility] to schedule first shipment Proivded her with direct phone number to [place]. Will try again tomorrow  Knox Saliva, PharmD, MPH, BCPS, CPP Clinical Pharmacist (Rheumatology and Pulmonology)

## 2022-12-21 ENCOUNTER — Other Ambulatory Visit (HOSPITAL_COMMUNITY): Payer: Self-pay

## 2022-12-21 NOTE — Telephone Encounter (Signed)
Patient returned call to the office to speak with [facility]. Request call back at [phone]

## 2022-12-21 NOTE — Telephone Encounter (Signed)
Returned call to pt. Phone went straight to VM. MyChart message sent,  Knox Saliva, PharmD, MPH, BCPS, CPP Clinical Pharmacist (Rheumatology and Pulmonology)

## 2022-12-22 ENCOUNTER — Other Ambulatory Visit (HOSPITAL_COMMUNITY): Payer: Self-pay

## 2022-12-22 NOTE — Telephone Encounter (Addendum)
Pt reached out and provided copay card info:  RxBIN: [id number] RxGRP: [id number] ID: [id number]  Delivery instructions have been updated in [facility], medication will be shipped to patient's home address by 12/26/22.  Rx has been processed in [facility] and copay card/PAP info has been provided to pharmacy. Patient will have a copay of $5.00. Payment information has been collected and forwarded to the pharmacy.   *NOTE Approval letter states that Josem Kaufmann is approved for 8m per 28 days between the dates 2/17 and 3/16, however both Rx's sent in were only for 173meach. Additionally, test claim for 71m49ms 28 day supply rejects citing PA required. Billing as 1mL59mr 28 days adjudicates successfully.   Processed as is for now, next dose is due 28 days after first injection and pt will be eligible for a refill around that time--however it will be outside of the loading dose auth window. Made note in [place] refill as 1mL 59m 28 days on 3/15 and to notify me with results. Further advised that if it adjudicates successfully we can possibly arrange to have it couriered to clinic if pt is non-responsive to refill calls in order to keep it from being reprocessed after the 3/16 expiration date.   [facility] to discuss if any adjustments and/or corrections will need to be made.

## 2022-12-25 ENCOUNTER — Other Ambulatory Visit: Payer: Self-pay

## 2022-12-25 ENCOUNTER — Other Ambulatory Visit (HOSPITAL_COMMUNITY): Payer: Self-pay

## 2022-12-27 ENCOUNTER — Other Ambulatory Visit (HOSPITAL_COMMUNITY): Payer: Self-pay

## 2022-12-27 DIAGNOSIS — J4520 Mild intermittent asthma, uncomplicated: Secondary | ICD-10-CM | POA: Diagnosis not present

## 2022-12-27 DIAGNOSIS — J0100 Acute maxillary sinusitis, unspecified: Secondary | ICD-10-CM | POA: Diagnosis not present

## 2022-12-27 DIAGNOSIS — F900 Attention-deficit hyperactivity disorder, predominantly inattentive type: Secondary | ICD-10-CM | POA: Diagnosis not present

## 2022-12-27 DIAGNOSIS — F324 Major depressive disorder, single episode, in partial remission: Secondary | ICD-10-CM | POA: Diagnosis not present

## 2022-12-27 DIAGNOSIS — I10 Essential (primary) hypertension: Secondary | ICD-10-CM | POA: Diagnosis not present

## 2022-12-27 DIAGNOSIS — M797 Fibromyalgia: Secondary | ICD-10-CM | POA: Diagnosis not present

## 2022-12-27 DIAGNOSIS — K582 Mixed irritable bowel syndrome: Secondary | ICD-10-CM | POA: Diagnosis not present

## 2022-12-27 DIAGNOSIS — L4050 Arthropathic psoriasis, unspecified: Secondary | ICD-10-CM | POA: Diagnosis not present

## 2022-12-27 DIAGNOSIS — K589 Irritable bowel syndrome without diarrhea: Secondary | ICD-10-CM | POA: Diagnosis not present

## 2022-12-27 MED ORDER — CEFDINIR 300 MG PO CAPS
300.0000 mg | ORAL_CAPSULE | Freq: Every day | ORAL | 0 refills | Status: DC
  Filled 2022-12-27: qty 10, 10d supply, fill #0

## 2022-12-27 MED ORDER — SERTRALINE HCL 100 MG PO TABS
200.0000 mg | ORAL_TABLET | Freq: Every day | ORAL | 1 refills | Status: DC
  Filled 2022-12-27 – 2023-01-06 (×2): qty 180, 90d supply, fill #0
  Filled 2023-04-17: qty 180, 90d supply, fill #1

## 2022-12-27 MED ORDER — FLUCONAZOLE 150 MG PO TABS
150.0000 mg | ORAL_TABLET | Freq: Once | ORAL | 0 refills | Status: AC
  Filled 2022-12-27: qty 2, 7d supply, fill #0

## 2022-12-28 ENCOUNTER — Other Ambulatory Visit (HOSPITAL_COMMUNITY): Payer: Self-pay

## 2022-12-28 ENCOUNTER — Encounter: Payer: Self-pay | Admitting: Pharmacist

## 2022-12-28 ENCOUNTER — Other Ambulatory Visit: Payer: Self-pay

## 2023-01-06 ENCOUNTER — Other Ambulatory Visit: Payer: Self-pay | Admitting: Internal Medicine

## 2023-01-06 DIAGNOSIS — L4050 Arthropathic psoriasis, unspecified: Secondary | ICD-10-CM

## 2023-01-06 DIAGNOSIS — Z79899 Other long term (current) drug therapy: Secondary | ICD-10-CM

## 2023-01-08 ENCOUNTER — Other Ambulatory Visit: Payer: Self-pay

## 2023-01-08 ENCOUNTER — Other Ambulatory Visit (HOSPITAL_COMMUNITY): Payer: Self-pay

## 2023-01-09 ENCOUNTER — Other Ambulatory Visit (HOSPITAL_COMMUNITY): Payer: Self-pay

## 2023-01-09 MED ORDER — CEFDINIR 300 MG PO CAPS
300.0000 mg | ORAL_CAPSULE | Freq: Every day | ORAL | 0 refills | Status: DC
  Filled 2023-01-09 (×2): qty 10, 10d supply, fill #0

## 2023-01-10 ENCOUNTER — Other Ambulatory Visit (HOSPITAL_COMMUNITY): Payer: Self-pay

## 2023-01-10 ENCOUNTER — Other Ambulatory Visit: Payer: Self-pay

## 2023-01-12 ENCOUNTER — Other Ambulatory Visit: Payer: Self-pay

## 2023-01-15 ENCOUNTER — Other Ambulatory Visit (HOSPITAL_COMMUNITY): Payer: Self-pay

## 2023-01-15 ENCOUNTER — Other Ambulatory Visit: Payer: Self-pay

## 2023-01-16 ENCOUNTER — Other Ambulatory Visit: Payer: Self-pay

## 2023-01-25 ENCOUNTER — Other Ambulatory Visit (HOSPITAL_COMMUNITY): Payer: Self-pay

## 2023-01-25 DIAGNOSIS — M25511 Pain in right shoulder: Secondary | ICD-10-CM | POA: Diagnosis not present

## 2023-01-25 MED ORDER — AMPHETAMINE-DEXTROAMPHETAMINE 30 MG PO TABS
30.0000 mg | ORAL_TABLET | Freq: Two times a day (BID) | ORAL | 0 refills | Status: DC
  Filled 2023-01-25 – 2023-01-26 (×2): qty 60, 30d supply, fill #0

## 2023-01-26 ENCOUNTER — Other Ambulatory Visit: Payer: Self-pay

## 2023-01-26 ENCOUNTER — Other Ambulatory Visit (HOSPITAL_COMMUNITY): Payer: Self-pay

## 2023-01-26 MED ORDER — LOSARTAN POTASSIUM 100 MG PO TABS
100.0000 mg | ORAL_TABLET | Freq: Every day | ORAL | 1 refills | Status: DC
  Filled 2023-01-26: qty 90, 90d supply, fill #0
  Filled 2023-05-14: qty 90, 90d supply, fill #1

## 2023-01-27 ENCOUNTER — Other Ambulatory Visit (HOSPITAL_COMMUNITY): Payer: Self-pay

## 2023-02-01 ENCOUNTER — Other Ambulatory Visit: Payer: Self-pay | Admitting: Obstetrics and Gynecology

## 2023-02-01 DIAGNOSIS — Z1231 Encounter for screening mammogram for malignant neoplasm of breast: Secondary | ICD-10-CM

## 2023-02-01 DIAGNOSIS — M25512 Pain in left shoulder: Secondary | ICD-10-CM | POA: Diagnosis not present

## 2023-02-08 DIAGNOSIS — M1711 Unilateral primary osteoarthritis, right knee: Secondary | ICD-10-CM | POA: Diagnosis not present

## 2023-03-06 ENCOUNTER — Other Ambulatory Visit (HOSPITAL_COMMUNITY): Payer: Self-pay

## 2023-03-07 ENCOUNTER — Other Ambulatory Visit: Payer: Self-pay

## 2023-03-07 ENCOUNTER — Other Ambulatory Visit (HOSPITAL_COMMUNITY): Payer: Self-pay

## 2023-03-07 MED ORDER — AMPHETAMINE-DEXTROAMPHETAMINE 30 MG PO TABS
30.0000 mg | ORAL_TABLET | Freq: Two times a day (BID) | ORAL | 0 refills | Status: DC
  Filled 2023-03-07: qty 60, 30d supply, fill #0

## 2023-03-08 ENCOUNTER — Other Ambulatory Visit (HOSPITAL_COMMUNITY): Payer: Self-pay

## 2023-03-08 ENCOUNTER — Other Ambulatory Visit: Payer: Self-pay

## 2023-03-13 ENCOUNTER — Other Ambulatory Visit (HOSPITAL_COMMUNITY): Payer: Self-pay

## 2023-03-13 NOTE — Progress Notes (Unsigned)
Office Visit Note  Patient: Megan Tanner             Date of Birth: July 02, 1966           MRN: [id number]             PCP: Johny Blamer, MD Referring: Johny Blamer, MD Visit Date: 03/14/2023   Subjective:  No chief complaint on file.   History of Present Illness: Megan Tanner is a 57 y.o. female here for follow up ***   Previous HPI 12/13/22  Megan Tanner is a 57 y.o. female here for follow up for inflammatory arthritis on HCQ 400 mg daily and ibuprofen 800 mg PRN. Symptoms remain about th same as last time. Lab testing was all negative except mildly elevated cholesterol. 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    Previous HPI 11/08/22 Megan Tanner is a 57 y.o. female here for second opinion for psoriatic arthritis. She was previously seeing Dr. Kathi Ludwig for management including plaquenil 400 mg daily. She has taken multiple previous treatments most recently Orencia with incomplete symptom improvement.  She is also using ibuprofen 800 mg as needed for breakthrough inflammatory and osteoarthritis pain.  She has tried several longer acting or prescription or topical NSAIDs all of which were not as effective as the high-dose ibuprofen. She has been having at least 5 or 6 years of persistent pain affecting her hands with worsening bony nodules with episodic redness swelling and nail changes. She has fingernail ridges with some nail discoloration and separation in the great toe.  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  So far oral and topical medications have not improved these rashes.  They are not painful or itchy. She has had a chronic injury to the right knee that occurred falling down stairs and sees Dr. Madelon Lips for long-term management has had MRIs but trying to avoid surgery.  Also has received previous  viscosupplementation injection series. She did not have great benefit from these. Steroid shots have been helpful for at least a month or more but not especially long either.    DMARD Hx Methotrexate - Rashes Leflunomide - Intolerance Humira - Incomplete response Remicade - Incomplete response Xeljanz - Nonresponder Cosentyx - Nonresponder Orencia - Nonresponder   No Rheumatology ROS completed.   PMFS History:  Patient Active Problem List   Diagnosis Date Noted   High risk medication use 12/13/2022   Chronic fatigue 07/06/2020   Fibromyalgia affecting multiple sites 07/06/2020   Hypertension 07/06/2020   IBS (irritable bowel syndrome) 07/06/2020   Migraines 07/06/2020   Osteoarthritis 07/06/2020   Psoriatic arthritis ([facility]) 07/06/2020   Restless leg syndrome 07/06/2020    Past Medical History:  Diagnosis Date   Allergy    Arthritis    Dry eyes    Dry mouth    Fatigue    Fibromyalgia    Hypertension    IBS (irritable bowel syndrome)    Migraines     Family History  Problem Relation Age of Onset   Osteoarthritis Mother    Hypertension Mother    Breast cancer Mother    Macular degeneration Mother    Heart disease Mother    COPD Mother    Glaucoma Mother    Osteoarthritis Father    Glaucoma Father    Hypertension Father  Stroke Father    Parkinson's disease Father    Dementia Father    Colon cancer Neg Hx    Colon polyps Neg Hx    Esophageal cancer Neg Hx    Rectal cancer Neg Hx    Stomach cancer Neg Hx    Past Surgical History:  Procedure Laterality Date   ABDOMINAL HYSTERECTOMY     c sections     1990 1993   KNEE SURGERY     KNEE SURGERY Right     has had 6 knee surgery   SHOULDER SURGERY     Social History   Social History Narrative   Not on file   Immunization History  Administered Date(s) Administered   PFIZER(Purple Top)SARS-COV-2 Vaccination 10/28/2019, 11/24/2019     Objective: Vital Signs: There were no vitals taken for this  visit.   Physical Exam   Musculoskeletal Exam: ***  CDAI Exam: CDAI Score: -- Patient Global: --; Provider Global: -- Swollen: --; Tender: -- Joint Exam 03/14/2023   No joint exam has been documented for this visit   There is currently no information documented on the homunculus. Go to the Rheumatology activity and complete the homunculus joint exam.  Investigation: No additional findings.  Imaging: No results found.  Recent Labs: Lab Results  Component Value Date   WBC 7.1 11/08/2022   HGB 15.7 (H) 11/08/2022   PLT 333 11/08/2022   NA 142 11/08/2022   K 4.7 11/08/2022   CL 103 11/08/2022   CO2 30 11/08/2022   GLUCOSE 113 (H) 11/08/2022   BUN 20 11/08/2022   CREATININE 0.81 11/08/2022   BILITOT 0.3 11/08/2022   AST 16 11/08/2022   ALT 16 11/08/2022   PROT 7.2 11/08/2022   CALCIUM 10.1 11/08/2022   QFTBGOLDPLUS NEGATIVE 11/08/2022    Speciality Comments: No specialty comments available.  Procedures:  No procedures performed Allergies: Codeine, Erythromycin, Methotrexate, Sulfa antibiotics, Tramadol, and Leflunomide   Assessment / Plan:     Visit Diagnoses: No diagnosis found.  ***  Orders: No orders of the defined types were placed in this encounter.  No orders of the defined types were placed in this encounter.    Follow-Up Instructions: No follow-ups on file.   Fuller Plan, MD  Note - This record has been created using [employer].  Chart creation errors have been sought, but may not always  have been located. Such creation errors do not reflect on  the standard of medical care.

## 2023-03-14 ENCOUNTER — Encounter: Payer: Self-pay | Admitting: Internal Medicine

## 2023-03-14 ENCOUNTER — Ambulatory Visit: Payer: 59 | Attending: Internal Medicine | Admitting: Internal Medicine

## 2023-03-14 VITALS — BP 117/82 | HR 85 | Resp 14 | Ht 60.0 in | Wt 152.0 lb

## 2023-03-14 DIAGNOSIS — L4050 Arthropathic psoriasis, unspecified: Secondary | ICD-10-CM

## 2023-03-14 DIAGNOSIS — M159 Polyosteoarthritis, unspecified: Secondary | ICD-10-CM

## 2023-03-14 DIAGNOSIS — Z79899 Other long term (current) drug therapy: Secondary | ICD-10-CM

## 2023-03-14 DIAGNOSIS — M797 Fibromyalgia: Secondary | ICD-10-CM | POA: Diagnosis not present

## 2023-03-14 LAB — CBC WITH DIFFERENTIAL/PLATELET
Absolute Monocytes: 646 cells/uL (ref 200–950)
Basophils Absolute: 57 cells/uL (ref 0–200)
Eosinophils Absolute: 277 cells/uL (ref 15–500)
Monocytes Relative: 9.1 %
RBC: 5.02 10*6/uL (ref 3.80–5.10)
Total Lymphocyte: 25.7 %
WBC: 7.1 10*3/uL (ref 3.8–10.8)

## 2023-03-15 LAB — COMPLETE METABOLIC PANEL WITH GFR
AG Ratio: 1.7 (calc) (ref 1.0–2.5)
ALT: 20 U/L (ref 6–29)
AST: 17 U/L (ref 10–35)
Albumin: 4.4 g/dL (ref 3.6–5.1)
Alkaline phosphatase (APISO): 55 U/L (ref 37–153)
BUN: 18 mg/dL (ref 7–25)
CO2: 31 mmol/L (ref 20–32)
Calcium: 9.9 mg/dL (ref 8.6–10.4)
Chloride: 100 mmol/L (ref 98–110)
Creat: 0.85 mg/dL (ref 0.50–1.03)
Globulin: 2.6 g/dL (calc) (ref 1.9–3.7)
Glucose, Bld: 87 mg/dL (ref 65–99)
Potassium: 4.2 mmol/L (ref 3.5–5.3)
Sodium: 140 mmol/L (ref 135–146)
Total Bilirubin: 0.4 mg/dL (ref 0.2–1.2)
Total Protein: 7 g/dL (ref 6.1–8.1)
eGFR: 80 mL/min/{1.73_m2} (ref >=60)

## 2023-03-15 LAB — CBC WITH DIFFERENTIAL/PLATELET
Basophils Relative: 0.8 %
Eosinophils Relative: 3.9 %
HCT: 45.9 % — ABNORMAL HIGH (ref 35.0–45.0)
Hemoglobin: 15.8 g/dL — ABNORMAL HIGH (ref 11.7–15.5)
Lymphs Abs: 1825 cells/uL (ref 850–3900)
MCH: 31.5 pg (ref 27.0–33.0)
MCHC: 34.4 g/dL (ref 32.0–36.0)
MCV: 91.4 fL (ref 80.0–100.0)
MPV: 9.9 fL (ref 7.5–12.5)
Neutro Abs: 4296 cells/uL (ref 1500–7800)
Neutrophils Relative %: 60.5 %
Platelets: 312 10*3/uL (ref 140–400)
RDW: 11.8 % (ref 11.0–15.0)

## 2023-03-15 LAB — C-REACTIVE PROTEIN: CRP: 3.2 mg/L (ref <8.0)

## 2023-03-16 ENCOUNTER — Ambulatory Visit
Admission: RE | Admit: 2023-03-16 | Discharge: 2023-03-16 | Disposition: A | Discharge status: Home / Self Care | Payer: 59 | Source: Ambulatory Visit | Attending: Obstetrics and Gynecology | Admitting: Obstetrics and Gynecology

## 2023-03-16 DIAGNOSIS — Z1231 Encounter for screening mammogram for malignant neoplasm of breast: Secondary | ICD-10-CM

## 2023-03-20 ENCOUNTER — Other Ambulatory Visit: Payer: Self-pay

## 2023-04-04 ENCOUNTER — Other Ambulatory Visit (HOSPITAL_COMMUNITY): Payer: Self-pay

## 2023-04-04 ENCOUNTER — Other Ambulatory Visit: Payer: Self-pay

## 2023-04-04 MED ORDER — HYDROCHLOROTHIAZIDE 25 MG PO TABS
25.0000 mg | ORAL_TABLET | Freq: Every day | ORAL | 1 refills | Status: DC
  Filled 2023-04-04: qty 90, 90d supply, fill #0
  Filled 2023-06-29: qty 90, 90d supply, fill #1

## 2023-04-05 ENCOUNTER — Other Ambulatory Visit: Payer: Self-pay

## 2023-04-05 ENCOUNTER — Other Ambulatory Visit (HOSPITAL_COMMUNITY): Payer: Self-pay

## 2023-04-05 MED ORDER — AMPHETAMINE-DEXTROAMPHETAMINE 30 MG PO TABS
30.0000 mg | ORAL_TABLET | Freq: Two times a day (BID) | ORAL | 0 refills | Status: DC
  Filled 2023-04-05: qty 60, 30d supply, fill #0

## 2023-04-06 ENCOUNTER — Other Ambulatory Visit: Payer: Self-pay

## 2023-04-16 ENCOUNTER — Encounter: Payer: Self-pay | Admitting: Internal Medicine

## 2023-04-25 ENCOUNTER — Other Ambulatory Visit (HOSPITAL_COMMUNITY): Payer: Self-pay

## 2023-04-25 DIAGNOSIS — Z1329 Encounter for screening for other suspected endocrine disorder: Secondary | ICD-10-CM | POA: Diagnosis not present

## 2023-04-25 DIAGNOSIS — H5203 Hypermetropia, bilateral: Secondary | ICD-10-CM | POA: Diagnosis not present

## 2023-04-25 DIAGNOSIS — H524 Presbyopia: Secondary | ICD-10-CM | POA: Diagnosis not present

## 2023-04-25 DIAGNOSIS — Z79899 Other long term (current) drug therapy: Secondary | ICD-10-CM | POA: Diagnosis not present

## 2023-04-25 DIAGNOSIS — Z6829 Body mass index (BMI) 29.0-29.9, adult: Secondary | ICD-10-CM | POA: Diagnosis not present

## 2023-04-25 DIAGNOSIS — Z01419 Encounter for gynecological examination (general) (routine) without abnormal findings: Secondary | ICD-10-CM | POA: Diagnosis not present

## 2023-04-25 DIAGNOSIS — E559 Vitamin D deficiency, unspecified: Secondary | ICD-10-CM | POA: Diagnosis not present

## 2023-04-25 MED ORDER — ESTRADIOL 0.1 MG/GM VA CREA
TOPICAL_CREAM | VAGINAL | 6 refills | Status: AC
  Filled 2023-04-25 – 2023-04-26 (×2): qty 42.5, 90d supply, fill #0
  Filled 2023-07-16 – 2023-07-17 (×2): qty 42.5, 90d supply, fill #1
  Filled 2023-10-12: qty 42.5, 90d supply, fill #2
  Filled 2024-01-17: qty 42.5, 90d supply, fill #3
  Filled 2024-04-11: qty 42.5, 90d supply, fill #4

## 2023-04-26 ENCOUNTER — Other Ambulatory Visit: Payer: Self-pay

## 2023-04-26 ENCOUNTER — Other Ambulatory Visit (HOSPITAL_COMMUNITY): Payer: Self-pay

## 2023-04-26 ENCOUNTER — Other Ambulatory Visit: Payer: Self-pay | Admitting: Internal Medicine

## 2023-04-26 DIAGNOSIS — Z79899 Other long term (current) drug therapy: Secondary | ICD-10-CM

## 2023-04-26 DIAGNOSIS — L4050 Arthropathic psoriasis, unspecified: Secondary | ICD-10-CM

## 2023-04-26 MED ORDER — TREMFYA 100 MG/ML ~~LOC~~ SOAJ
SUBCUTANEOUS | 1 refills | Status: DC
Start: 2023-04-26 — End: 2023-06-26
  Filled 2023-04-26: qty 1, 56d supply, fill #0

## 2023-04-26 NOTE — Telephone Encounter (Signed)
Last Fill: 12/18/2022  Labs: 03/14/2023 Blood count looks fine hemoglobin is slightly above normal at 15.8 not significantly different from 4 months ago.  Metabolic panel is normal.  CRP remains normal.  No new medication changes needed.   TB Gold: 11/08/2022 Negative   Next Visit: 09/19/2023  Last Visit: 03/14/2023  HQ:IONGEXBMW arthritis   Current Dose per office note 03/14/2023: Tremfya now maintenance dose 100 mg subcu every 8 weeks   Okay to refill Tremfyia?

## 2023-05-08 ENCOUNTER — Other Ambulatory Visit (HOSPITAL_COMMUNITY): Payer: Self-pay

## 2023-05-10 ENCOUNTER — Encounter: Payer: Self-pay | Admitting: Internal Medicine

## 2023-05-11 ENCOUNTER — Other Ambulatory Visit: Payer: Self-pay | Admitting: Oncology

## 2023-05-11 DIAGNOSIS — Z006 Encounter for examination for normal comparison and control in clinical research program: Secondary | ICD-10-CM

## 2023-05-16 DIAGNOSIS — N952 Postmenopausal atrophic vaginitis: Secondary | ICD-10-CM | POA: Diagnosis not present

## 2023-05-25 ENCOUNTER — Encounter: Payer: Self-pay | Admitting: Internal Medicine

## 2023-05-25 NOTE — Telephone Encounter (Signed)
The rashes are not typical for a side effect from this injection. But if the tremfya is not helping her symptoms we may need to see her back to take another look. We can move up her appointment to some time next month if interested.

## 2023-05-25 NOTE — Telephone Encounter (Signed)
Attempted to contact the patient and left a message to call the office back. 

## 2023-05-31 NOTE — Progress Notes (Signed)
Office Visit Note  Patient: Megan Tanner             Date of Birth: January 12, 1966           MRN: [id number]             PCP: Noberto Retort, MD Referring: Noberto Retort, MD Visit Date: 06/11/2023   Subjective:  Follow-up (Patient states the Tremfya is not working. Patient has a rash coming back. Patient states she recently had shingles. )   History of Present Illness: Megan Tanner is a 57 y.o. female here for follow up for psoriatic arthritis on hydroxychloroquine for 400 mg daily and Tremfya 100 mg every 8 weeks since starting in February.  Currently feels symptoms are not well-controlled.  Pain is worse after activity and severely limiting her exercise.  She thought there was initial benefit in joint pain and stiffness with the start of Tremfya and definite decrease in skin rashes.  However after the first 2 doses feels the symptoms were not getting back more to at baseline. She had breakout of painful rash on her right breast similar to previous shingles at this area. Subsequently also with some rash around the eyes that was new not particularly associated by timing with the Tremfya injections.  Currently having joint pain most regularly in her fingers and the anterior portion of both feet.  Right knee pain wearing a brace for this had steroid injection with partial benefit.  Previous HPI 03/14/2023 Megan Tanner is a 57 y.o. female here for follow up for psoriatic arthritis on hydroxychloroquine for 400 mg daily and after starting Tremfya 100 mg in February now at maintenance scheduled every 8 weeks.  Within the first month after starting she saw clearance of skin rashes on her elbows and groin area completely.  Has still had persistent joint issues had x-ray and a steroid injection in the right shoulder.  Also had a Synvisc injection in the right knee about a month ago for osteoarthritis.  Still has some persistent pain in that area specially has to take caution with climbing  stairs and is wearing knee braces for support.  Also with some right ankle pain.  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   Previous HPI 12/13/22 Megan Tanner is a 57 y.o. female here for follow up for inflammatory arthritis on HCQ 400 mg daily and ibuprofen 800 mg PRN. Symptoms remain about the same as last time. Lab testing was all negative except mildly elevated cholesterol. 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    Previous HPI 11/08/22 Megan Tanner is a 57 y.o. female here for second opinion for psoriatic arthritis. She was previously seeing Dr. Kathi Ludwig for management including plaquenil 400 mg daily. She has taken multiple previous treatments most recently Orencia with incomplete symptom improvement.  She is also using ibuprofen 800 mg as needed for breakthrough inflammatory and osteoarthritis pain.  She has tried several longer acting or prescription or topical NSAIDs all of which were not as effective as the high-dose ibuprofen. She has been having at least 5 or 6 years of persistent pain affecting her hands with worsening bony nodules with episodic redness swelling and nail changes. She has fingernail ridges with some nail discoloration and separation in the great toe.  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  So far oral and topical medications have not improved these rashes.  They are not painful or itchy. She has had a chronic injury to the right knee that occurred falling down stairs and sees Dr. Madelon Lips for long-term management has had MRIs but trying to avoid surgery.  Also has received previous viscosupplementation injection series. She did not have great benefit from these. Steroid shots have been helpful for  at least a month or more but not especially long either.    DMARD Hx Methotrexate - Rashes Leflunomide - Intolerance Humira - Incomplete response Remicade - Incomplete response Xeljanz - Nonresponder Cosentyx - Nonresponder Orencia - Nonresponder   Review of Systems  Constitutional:  Positive for fatigue.  HENT:  Positive for mouth sores and mouth dryness.   Eyes:  Positive for dryness.  Respiratory:  Negative for shortness of breath.   Cardiovascular:  Negative for chest pain and palpitations.  Gastrointestinal:  Positive for constipation and diarrhea. Negative for blood in stool.  Endocrine: Negative for increased urination.  Genitourinary:  Negative for involuntary urination.  Musculoskeletal:  Positive for joint pain, joint pain, joint swelling, myalgias, morning stiffness, muscle tenderness and myalgias. Negative for gait problem and muscle weakness.  Skin:  Positive for rash. Negative for color change, hair loss and sensitivity to sunlight.  Allergic/Immunologic: Positive for susceptible to infections.  Neurological:  Positive for dizziness and headaches.  Hematological:  Negative for swollen glands.  Psychiatric/Behavioral:  Positive for depressed mood and sleep disturbance. The patient is not nervous/anxious.     PMFS History:  Patient Active Problem List   Diagnosis Date Noted   High risk medication use 12/13/2022   Chronic fatigue 07/06/2020   Fibromyalgia affecting multiple sites 07/06/2020   Hypertension 07/06/2020   IBS (irritable bowel syndrome) 07/06/2020   Migraines 07/06/2020   Osteoarthritis 07/06/2020   Psoriatic arthritis (HCC) 07/06/2020   Restless leg syndrome 07/06/2020    Past Medical History:  Diagnosis Date   Allergy    Arthritis    Dry eyes    Dry mouth    Fatigue    Fibromyalgia    Hypertension    IBS (irritable bowel syndrome)    Migraines     Family History  Problem Relation Age of Onset   Osteoarthritis Mother    Hypertension  Mother    Breast cancer Mother    Macular degeneration Mother    Heart disease Mother    COPD Mother    Glaucoma Mother    Osteoarthritis Father    Glaucoma Father    Hypertension Father    Stroke Father    Parkinson's disease Father    Dementia Father    Colon cancer Neg Hx    Colon polyps Neg Hx    Esophageal cancer Neg Hx    Rectal cancer Neg Hx    Stomach cancer Neg Hx    Past Surgical History:  Procedure Laterality Date   ABDOMINAL HYSTERECTOMY     c sections     1990 1993   KNEE SURGERY     KNEE SURGERY Right     has had 6 knee surgery   SHOULDER SURGERY     Social History   Social History Narrative   Not on file   Immunization History  Administered Date(s) Administered   PFIZER(Purple Top)SARS-COV-2 Vaccination 10/28/2019, 11/24/2019     Objective: Vital Signs:  BP 118/79 (BP Location: Left Arm, Patient Position: Sitting, Cuff Size: Normal)   Pulse 96   Resp 12   Ht 5' (1.524 m)   Wt 152 lb (68.9 kg)   BMI 29.69 kg/m    Physical Exam Eyes:     Conjunctiva/sclera: Conjunctivae normal.  Cardiovascular:     Rate and Rhythm: Normal rate and regular rhythm.  Pulmonary:     Effort: Pulmonary effort is normal.     Breath sounds: Normal breath sounds.  Musculoskeletal:     Right lower leg: No edema.     Left lower leg: No edema.  Lymphadenopathy:     Cervical: No cervical adenopathy.  Skin:    General: Skin is warm and dry.     Findings: No rash.  Neurological:     Mental Status: She is alert.  Psychiatric:        Mood and Affect: Mood normal.      Musculoskeletal Exam:  Shoulders full ROM no tenderness or swelling Elbows full ROM no tenderness or swelling Wrists full ROM no tenderness or swelling Fingers full ROM, tenderness at multiple joints, DIP heberdon's nodes with mild flexion deformity, no palpable swelling. Knees full ROM, right knee wearing soft brace, medial joint line tenderness to pressure Ankles full ROM no tenderness or  swelling   Investigation: No additional findings.  Imaging: No results found.  Recent Labs: Lab Results  Component Value Date   WBC 7.1 03/14/2023   HGB 15.8 (H) 03/14/2023   PLT 312 03/14/2023   NA 140 03/14/2023   K 4.2 03/14/2023   CL 100 03/14/2023   CO2 31 03/14/2023   GLUCOSE 87 03/14/2023   BUN 18 03/14/2023   CREATININE 0.85 03/14/2023   BILITOT 0.4 03/14/2023   AST 17 03/14/2023   ALT 20 03/14/2023   PROT 7.0 03/14/2023   CALCIUM 9.9 03/14/2023   QFTBGOLDPLUS NEGATIVE 11/08/2022    Speciality Comments: No specialty comments available.  Procedures:  No procedures performed Allergies: Codeine, Erythromycin, Methotrexate, Sulfa antibiotics, Tramadol, and Leflunomide   Assessment / Plan:     Visit Diagnoses: Psoriatic arthritis (HCC) Primary osteoarthritis involving multiple joints  On hydroxychloroquine 400 mg daily and ibuprofen 800 mg 2-3 times daily.   I do not see much peripheral joint synovitis on exam today I think more of her pain may be coming from the existing OA as the reason for lack of response.  Has already tried numerous supplemental unsupportive options provided handout on this again today. Will recheck sed rate and CRP for inflammatory activity monitoring. Does not seem to be getting much clinical benefit in arthritis with the Tremfya will stop this for now and monitor symptoms.   High risk medication use - Needs PLQ eye exam  Need hydroxychloroquine eye exam for review.  Currently at 400 mg daily dose which is slightly above long-term maintenance recommended dose based on current body mass of 68.9 kg.  Rechecking complete metabolic panel for medication monitoring with continued use of moderate to high dose ibuprofen and hydroxychloroquine.   Fibromyalgia affecting multiple sites  Previously tried several neuropathic medications had excess side effects on gabapentin and lack of benefit on duloxetine.  May be contributing to varying widespread  joint pain and associated fatigue.  Orders: Orders Placed This Encounter  Procedures   BASIC METABOLIC PANEL WITH GFR   Sedimentation rate   C-reactive protein   No orders of the defined types were placed in this encounter.    Follow-Up Instructions:  Return in about 5 months (around 11/11/2023) for PsA/OA on HCQ/NSAID f/u 5mos.   Fuller Plan, MD  Note - This record has been created using [employer].  Chart creation errors have been sought, but may not always  have been located. Such creation errors do not reflect on  the standard of medical care.

## 2023-06-01 ENCOUNTER — Other Ambulatory Visit: Payer: Self-pay

## 2023-06-04 ENCOUNTER — Other Ambulatory Visit: Payer: Self-pay

## 2023-06-04 MED ORDER — AMPHETAMINE-DEXTROAMPHETAMINE 30 MG PO TABS
30.0000 mg | ORAL_TABLET | Freq: Two times a day (BID) | ORAL | 0 refills | Status: DC
  Filled 2023-06-04: qty 60, 30d supply, fill #0

## 2023-06-08 ENCOUNTER — Telehealth: Payer: Self-pay

## 2023-06-08 ENCOUNTER — Other Ambulatory Visit: Payer: Self-pay

## 2023-06-08 NOTE — Telephone Encounter (Signed)
Per Rema Jasmine, current Prior Authorization is expiring.  Submitted a Prior Authorization request to [facility] for [facility] via CoverMyMeds. Will update once we receive a response.   Key: [id number]

## 2023-06-11 ENCOUNTER — Ambulatory Visit: Payer: 59 | Attending: Internal Medicine | Admitting: Internal Medicine

## 2023-06-11 ENCOUNTER — Encounter: Payer: Self-pay | Admitting: Internal Medicine

## 2023-06-11 VITALS — BP 118/79 | HR 96 | Resp 12 | Ht 60.0 in | Wt 152.0 lb

## 2023-06-11 DIAGNOSIS — M797 Fibromyalgia: Secondary | ICD-10-CM

## 2023-06-11 DIAGNOSIS — L4050 Arthropathic psoriasis, unspecified: Secondary | ICD-10-CM

## 2023-06-11 DIAGNOSIS — Z79899 Other long term (current) drug therapy: Secondary | ICD-10-CM | POA: Diagnosis not present

## 2023-06-11 DIAGNOSIS — M159 Polyosteoarthritis, unspecified: Secondary | ICD-10-CM

## 2023-06-11 NOTE — Patient Instructions (Signed)

## 2023-06-12 LAB — BASIC METABOLIC PANEL WITH GFR
BUN: 19 mg/dL (ref 7–25)
CO2: 31 mmol/L (ref 20–32)
Calcium: 10.1 mg/dL (ref 8.6–10.4)
Chloride: 102 mmol/L (ref 98–110)
Creat: 0.78 mg/dL (ref 0.50–1.03)
Glucose, Bld: 95 mg/dL (ref 65–99)
Potassium: 3.9 mmol/L (ref 3.5–5.3)
Sodium: 143 mmol/L (ref 135–146)
eGFR: 89 mL/min/{1.73_m2} (ref >=60)

## 2023-06-12 LAB — SEDIMENTATION RATE: Sed Rate: 2 mm/h (ref 0–30)

## 2023-06-12 NOTE — Telephone Encounter (Signed)
Received notification from [facility] regarding a prior authorization for [facility]. Authorization has been APPROVED from 06/12/23 to 06/10/2024. Approval letter sent to scan center.  Patient must continue to fill through [facility]: [phone]   Authorization # [id number]  [employer] updated.  Chesley Mires, PharmD, MPH, BCPS, CPP Clinical Pharmacist (Rheumatology and Pulmonology)

## 2023-06-26 ENCOUNTER — Other Ambulatory Visit: Payer: Self-pay

## 2023-06-27 ENCOUNTER — Other Ambulatory Visit: Payer: Self-pay

## 2023-06-27 DIAGNOSIS — J4520 Mild intermittent asthma, uncomplicated: Secondary | ICD-10-CM | POA: Diagnosis not present

## 2023-06-27 DIAGNOSIS — F324 Major depressive disorder, single episode, in partial remission: Secondary | ICD-10-CM | POA: Diagnosis not present

## 2023-06-27 DIAGNOSIS — I10 Essential (primary) hypertension: Secondary | ICD-10-CM | POA: Diagnosis not present

## 2023-06-27 DIAGNOSIS — M797 Fibromyalgia: Secondary | ICD-10-CM | POA: Diagnosis not present

## 2023-06-27 DIAGNOSIS — F900 Attention-deficit hyperactivity disorder, predominantly inattentive type: Secondary | ICD-10-CM | POA: Diagnosis not present

## 2023-06-27 DIAGNOSIS — K582 Mixed irritable bowel syndrome: Secondary | ICD-10-CM | POA: Diagnosis not present

## 2023-06-27 DIAGNOSIS — L4050 Arthropathic psoriasis, unspecified: Secondary | ICD-10-CM | POA: Diagnosis not present

## 2023-06-27 MED ORDER — BUPROPION HCL ER (XL) 150 MG PO TB24
150.0000 mg | ORAL_TABLET | Freq: Every day | ORAL | 5 refills | Status: DC
  Filled 2023-06-27: qty 30, 30d supply, fill #0
  Filled 2023-07-24: qty 30, 30d supply, fill #1

## 2023-06-27 MED ORDER — LOSARTAN POTASSIUM 100 MG PO TABS
100.0000 mg | ORAL_TABLET | Freq: Every day | ORAL | 1 refills | Status: DC
  Filled 2023-06-27 – 2023-08-08 (×2): qty 90, 90d supply, fill #0
  Filled 2023-11-01: qty 90, 90d supply, fill #1

## 2023-07-09 ENCOUNTER — Other Ambulatory Visit: Payer: Self-pay | Admitting: Internal Medicine

## 2023-07-09 ENCOUNTER — Other Ambulatory Visit: Payer: Self-pay

## 2023-07-09 ENCOUNTER — Other Ambulatory Visit (HOSPITAL_COMMUNITY): Payer: Self-pay

## 2023-07-09 DIAGNOSIS — M069 Rheumatoid arthritis, unspecified: Secondary | ICD-10-CM

## 2023-07-09 MED ORDER — SERTRALINE HCL 100 MG PO TABS
200.0000 mg | ORAL_TABLET | Freq: Every day | ORAL | 2 refills | Status: DC
  Filled 2023-07-09: qty 180, 90d supply, fill #0
  Filled 2023-10-12: qty 180, 90d supply, fill #1
  Filled 2024-01-09: qty 180, 90d supply, fill #2

## 2023-07-09 MED ORDER — HYDROXYCHLOROQUINE SULFATE 200 MG PO TABS
400.0000 mg | ORAL_TABLET | Freq: Every day | ORAL | 1 refills | Status: DC
  Filled 2023-07-09: qty 180, 90d supply, fill #0
  Filled 2023-10-05: qty 180, 90d supply, fill #1

## 2023-07-09 NOTE — Telephone Encounter (Signed)
Last Fill: 11/08/2022  Eye exam: PLQ Eye Exam 03/2023 per patient, patient will fax results to office   Labs: 06/11/2023 BMP normal, 03/14/2023  Blood count looks fine hemoglobin is slightly above normal at 15.8 not significantly different from 4 months ago.  Metabolic panel is normal.  CRP remains normal.  No new medication changes needed.   Next Visit: 11/14/2023  Last Visit: 06/11/2023  [id number] arthritis   Current Dose per office note 06/11/2023: hydroxychloroquine 400 mg daily   Okay to refill Plaquenil?

## 2023-07-10 ENCOUNTER — Other Ambulatory Visit (HOSPITAL_COMMUNITY): Payer: Self-pay

## 2023-07-11 ENCOUNTER — Other Ambulatory Visit: Payer: Self-pay

## 2023-07-11 ENCOUNTER — Other Ambulatory Visit (HOSPITAL_COMMUNITY): Payer: Self-pay

## 2023-07-11 MED ORDER — AMPHETAMINE-DEXTROAMPHETAMINE 30 MG PO TABS
1.0000 | ORAL_TABLET | Freq: Two times a day (BID) | ORAL | 0 refills | Status: DC
  Filled 2023-07-11: qty 60, 30d supply, fill #0

## 2023-07-12 ENCOUNTER — Other Ambulatory Visit: Payer: Self-pay

## 2023-07-13 ENCOUNTER — Other Ambulatory Visit: Payer: Self-pay

## 2023-07-16 ENCOUNTER — Encounter: Payer: Self-pay | Admitting: Internal Medicine

## 2023-07-17 ENCOUNTER — Other Ambulatory Visit (HOSPITAL_COMMUNITY): Payer: Self-pay

## 2023-07-17 ENCOUNTER — Other Ambulatory Visit: Payer: Self-pay

## 2023-07-17 DIAGNOSIS — M170 Bilateral primary osteoarthritis of knee: Secondary | ICD-10-CM | POA: Diagnosis not present

## 2023-07-24 ENCOUNTER — Other Ambulatory Visit: Payer: Self-pay

## 2023-08-01 ENCOUNTER — Encounter: Payer: Self-pay | Admitting: Internal Medicine

## 2023-08-01 ENCOUNTER — Other Ambulatory Visit (HOSPITAL_COMMUNITY): Payer: Self-pay

## 2023-08-01 ENCOUNTER — Ambulatory Visit (AMBULATORY_SURGERY_CENTER): Payer: 59

## 2023-08-01 VITALS — Ht 60.0 in | Wt 147.0 lb

## 2023-08-01 DIAGNOSIS — Z860100 Personal history of colon polyps, unspecified: Secondary | ICD-10-CM

## 2023-08-01 DIAGNOSIS — L4050 Arthropathic psoriasis, unspecified: Secondary | ICD-10-CM

## 2023-08-01 MED ORDER — NA SULFATE-K SULFATE-MG SULF 17.5-3.13-1.6 GM/177ML PO SOLN
1.0000 | Freq: Once | ORAL | 0 refills | Status: AC
Start: 2023-08-01 — End: 2023-08-03
  Filled 2023-08-01: qty 354, 2d supply, fill #0
  Filled 2023-08-01: qty 354, 1d supply, fill #0

## 2023-08-01 MED ORDER — PREDNISONE 5 MG PO TABS
5.0000 mg | ORAL_TABLET | Freq: Every day | ORAL | 0 refills | Status: DC | PRN
Start: 2023-08-01 — End: 2023-10-17
  Filled 2023-08-01 (×2): qty 15, 7d supply, fill #0

## 2023-08-01 NOTE — Telephone Encounter (Signed)
Patient is currently prescribed hydroxychloroquine by our office. Please advise.

## 2023-08-01 NOTE — Progress Notes (Signed)
No egg or soy allergy known to patient  No issues known to pt with past sedation with any surgeries or procedures Patient denies ever being told they had issues or difficulty with intubation  No FH of Malignant Hyperthermia Pt is not on diet pills Pt is not on  home 02  Pt is not on blood thinners  Pt denies issues with constipation takes miralax and magnesium OTC  No A fib or A flutter Have any cardiac testing pending--no  LOA: independent  Prep: suprep  Patient's chart reviewed by Cathlyn Parsons CNRA prior to previsit and patient appropriate for the [facility].  Previsit completed and red dot placed by patient's name on their procedure day (on provider's schedule).     PV competed with patient. Prep instructions sent via mychart and home address.

## 2023-08-05 ENCOUNTER — Other Ambulatory Visit (HOSPITAL_COMMUNITY): Payer: Self-pay

## 2023-08-06 ENCOUNTER — Other Ambulatory Visit (HOSPITAL_COMMUNITY): Payer: Self-pay

## 2023-08-06 MED ORDER — ALBUTEROL SULFATE HFA 108 (90 BASE) MCG/ACT IN AERS
INHALATION_SPRAY | RESPIRATORY_TRACT | 1 refills | Status: DC
  Filled 2023-08-06: qty 6.7, 25d supply, fill #0
  Filled 2023-08-08: qty 6.7, 17d supply, fill #0
  Filled 2023-10-12: qty 6.7, 17d supply, fill #1

## 2023-08-07 ENCOUNTER — Encounter: Payer: Self-pay | Admitting: Gastroenterology

## 2023-08-07 ENCOUNTER — Encounter (HOSPITAL_COMMUNITY): Payer: Self-pay

## 2023-08-08 ENCOUNTER — Other Ambulatory Visit (HOSPITAL_COMMUNITY): Payer: Self-pay

## 2023-08-08 ENCOUNTER — Other Ambulatory Visit: Payer: Self-pay

## 2023-08-15 DIAGNOSIS — D235 Other benign neoplasm of skin of trunk: Secondary | ICD-10-CM | POA: Diagnosis not present

## 2023-08-15 DIAGNOSIS — Z85828 Personal history of other malignant neoplasm of skin: Secondary | ICD-10-CM | POA: Diagnosis not present

## 2023-08-15 DIAGNOSIS — L710 Perioral dermatitis: Secondary | ICD-10-CM | POA: Diagnosis not present

## 2023-08-15 DIAGNOSIS — L905 Scar conditions and fibrosis of skin: Secondary | ICD-10-CM | POA: Diagnosis not present

## 2023-08-15 DIAGNOSIS — L400 Psoriasis vulgaris: Secondary | ICD-10-CM | POA: Diagnosis not present

## 2023-08-15 DIAGNOSIS — L821 Other seborrheic keratosis: Secondary | ICD-10-CM | POA: Diagnosis not present

## 2023-08-17 ENCOUNTER — Other Ambulatory Visit (HOSPITAL_COMMUNITY): Payer: Self-pay

## 2023-08-20 ENCOUNTER — Encounter: Payer: Self-pay | Admitting: Gastroenterology

## 2023-08-20 ENCOUNTER — Ambulatory Visit (AMBULATORY_SURGERY_CENTER): Payer: 59 | Admitting: Gastroenterology

## 2023-08-20 ENCOUNTER — Other Ambulatory Visit: Payer: Self-pay

## 2023-08-20 VITALS — BP 103/66 | HR 66 | Temp 98.0°F | Resp 15 | Ht 60.0 in | Wt 147.0 lb

## 2023-08-20 DIAGNOSIS — Z860100 Personal history of colon polyps, unspecified: Secondary | ICD-10-CM

## 2023-08-20 DIAGNOSIS — Z09 Encounter for follow-up examination after completed treatment for conditions other than malignant neoplasm: Secondary | ICD-10-CM | POA: Diagnosis not present

## 2023-08-20 DIAGNOSIS — D120 Benign neoplasm of cecum: Secondary | ICD-10-CM | POA: Diagnosis not present

## 2023-08-20 DIAGNOSIS — M797 Fibromyalgia: Secondary | ICD-10-CM | POA: Diagnosis not present

## 2023-08-20 DIAGNOSIS — I10 Essential (primary) hypertension: Secondary | ICD-10-CM | POA: Diagnosis not present

## 2023-08-20 DIAGNOSIS — Z1211 Encounter for screening for malignant neoplasm of colon: Secondary | ICD-10-CM | POA: Diagnosis not present

## 2023-08-20 MED ORDER — BUPROPION HCL ER (XL) 300 MG PO TB24
300.0000 mg | ORAL_TABLET | Freq: Every morning | ORAL | 5 refills | Status: DC
  Filled 2023-08-20: qty 30, 30d supply, fill #0
  Filled 2023-09-20: qty 30, 30d supply, fill #1
  Filled 2023-09-25 – 2023-10-18 (×2): qty 30, 30d supply, fill #2
  Filled 2023-11-01 – 2023-11-25 (×3): qty 30, 30d supply, fill #3
  Filled 2023-12-18: qty 30, 30d supply, fill #4

## 2023-08-20 MED ORDER — SODIUM CHLORIDE 0.9 % IV SOLN: 500.0000 mL | Freq: Once | INTRAVENOUS | Status: DC

## 2023-08-20 NOTE — Progress Notes (Signed)
Pt's states no medical or surgical changes since previsit or office visit. 

## 2023-08-20 NOTE — Progress Notes (Signed)
To pacu, VSS. Report to [email or web address] 

## 2023-08-20 NOTE — Progress Notes (Signed)
Called to room to assist during endoscopic procedure.  Patient ID and intended procedure confirmed with present staff. Received instructions for my participation in the procedure from the performing physician.  

## 2023-08-20 NOTE — Progress Notes (Signed)
[facility] History and Physical   Primary Care Physician:  Noberto Retort, MD   Reason for Procedure:  History of adenomatous colon polyps  Plan:    Surveillance colonoscopy with possible interventions as needed     HPI: Megan Tanner is a very pleasant 57 y.o. female here for surveillance colonoscopy. Denies any nausea, vomiting, abdominal pain, melena or bright red blood per rectum  The risks and benefits as well as alternatives of endoscopic procedure(s) have been discussed and reviewed. All questions answered. The patient agrees to proceed.    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 Authorizing Provider  acetaminophen (TYLENOL) 325 MG tablet Take 325 mg by mouth every 4 (four) hours as needed.   Yes [provider]  amphetamine-dextroamphetamine (ADDERALL) 30 MG tablet Take 1 tablet by mouth 2 (two) times daily. 07/10/23  Yes   buPROPion (WELLBUTRIN XL) 150 MG 24 hr tablet Take 1 tablet (150 mg total) by mouth daily. 06/27/23  Yes   fexofenadine (ALLEGRA ALLERGY) 180 MG tablet    Yes [provider]  hydrochlorothiazide (HYDRODIURIL) 25 MG tablet Take 1 tablet (25 mg total) by mouth daily in the morning. 04/04/23  Yes   hydroxychloroquine (PLAQUENIL) 200 MG tablet Take 2 tablets (400 mg total) by mouth daily with food or milk. 07/09/23  Yes Rice, Jamesetta Orleans, MD  [place] [employer] Omega-3 500 MG CAPS    Yes [provider]  losartan (COZAAR) 100 MG tablet Take 1 tablet (100 mg total)  by mouth daily. 06/27/23  Yes   predniSONE (DELTASONE) 5 MG tablet Take 1-2 tablets (5-10 mg total) by mouth daily as needed. 08/01/23  Yes Rice, Jamesetta Orleans, MD  sertraline (ZOLOFT) 100 MG tablet Take 2 tablets (200 mg total) by mouth daily. 07/09/23  Yes   albuterol (VENTOLIN HFA) 108 (90 Base) MCG/ACT inhaler Inhale 2 puffs into the lungs as needed every 4 hours 08/06/23     Calcium Carbonate-Vitamin D (CALTRATE 600+D PO) Take by mouth.    [provider]  Coenzyme Q10 200 MG capsule Take by oral route.    [provider]  estradiol (ESTRACE) 0.1 MG/GM vaginal cream Insert 1 gram vaginally twice a week at bedtime Patient taking differently: Place 1 Applicatorful vaginally once a week. 04/25/23     Hyoscyamine Sulfate SL 0.125 MG SUBL Allow 1 tablet to dissolve  under the tongue 3 times a day as needed for colon spasm 08/15/21     ibuprofen (ADVIL) 800 MG tablet Take 1 tablet (800 mg total) by mouth 3 (three) times daily with food or milk 06/14/22     Magnesium 400 MG CAPS     [provider]  Polyethylene Glycol 3350 (MIRALAX PO) as directed Orally once a day    [provider]  promethazine (PHENERGAN) 25 MG tablet 1-2 tablets as needed for migraines Orally every 12 hrs for 30 day(s) 05/28/18   [provider]  rizatriptan (MAXALT) 10 MG tablet Take 1 tablet by mouth once a day if needed for migraine. May repeat in 2 hours if needed. 11/30/21     guselkumab (TREMFYA) 100 MG/ML pen Inject 100mg  into the skin at Week 4 then every 8 weeks thereafter 04/26/23 06/26/23  Fuller Plan, MD    Current Outpatient Medications  Medication Sig Dispense Refill   acetaminophen (TYLENOL) 325 MG tablet Take 325 mg by mouth every 4 (four) hours as needed.     amphetamine-dextroamphetamine (ADDERALL) 30 MG tablet Take 1 tablet by mouth 2 (two) times daily. 60 tablet 0   buPROPion (WELLBUTRIN XL) 150 MG 24 hr tablet Take 1 tablet (150 mg total) by mouth daily. 30 tablet  5   fexofenadine (ALLEGRA ALLERGY) 180 MG tablet      hydrochlorothiazide (HYDRODIURIL) 25 MG tablet Take 1 tablet (25 mg total) by mouth daily in the morning. 90 tablet 1   hydroxychloroquine (PLAQUENIL) 200 MG tablet Take 2 tablets (400 mg total) by mouth daily with food or milk. 180 tablet 1   Krill Oil Omega-3 500 MG CAPS      losartan (COZAAR) 100 MG tablet Take 1 tablet (100 mg total) by mouth daily. 90 tablet 1   predniSONE (DELTASONE) 5 MG tablet Take 1-2 tablets (5-10 mg total) by mouth daily as needed. 15 tablet 0   sertraline (ZOLOFT) 100 MG tablet Take 2 tablets (200 mg total) by mouth daily. 180 tablet 2   albuterol (VENTOLIN HFA) 108 (90 Base) MCG/ACT inhaler Inhale 2 puffs into the lungs as needed every 4 hours 6.7 g 1   Calcium Carbonate-Vitamin D (CALTRATE 600+D PO) Take by mouth.     Coenzyme Q10 200 MG capsule Take by oral route.     estradiol (ESTRACE) 0.1 MG/GM vaginal cream Insert 1 gram vaginally twice a week at bedtime (Patient taking differently: Place 1 Applicatorful vaginally once a week.) 42.5 g 6   Hyoscyamine Sulfate SL 0.125 MG SUBL Allow 1 tablet to dissolve  under the tongue 3 times a day as needed for colon spasm 30 tablet 1   ibuprofen (ADVIL) 800 MG tablet Take 1 tablet (800 mg total) by mouth 3 (three) times daily with food or milk 270 tablet 3   Magnesium 400 MG CAPS      Polyethylene Glycol 3350 (MIRALAX PO) as directed Orally once a day     promethazine (PHENERGAN) 25 MG tablet 1-2 tablets as needed for migraines Orally every 12 hrs for 30 day(s)     rizatriptan (MAXALT) 10 MG tablet Take 1 tablet by mouth once a day if needed for migraine. May repeat in 2 hours if needed. 9 tablet 6   Current Facility-Administered Medications  Medication Dose Route Frequency Provider Last Rate Last Admin   0.9 %  sodium chloride infusion  500 mL Intravenous Once Napoleon Form, MD        Allergies as of 08/20/2023 - Review Complete 08/20/2023  Allergen Reaction  Noted   Codeine Itching 07/07/2015   Erythromycin Nausea And Vomiting 11/08/2022   Fluticasone Other (See Comments) 08/01/2023   Lisinopril Cough 08/01/2023   Methotrexate Other (See Comments) 07/06/2020   Sulfa antibiotics Nausea And Vomiting 07/07/2015   Sumatriptan  08/01/2023   Tramadol Other (See Comments) 11/08/2022   Tyloxapol  08/01/2023  Family History  Problem Relation Age of Onset   Osteoarthritis Mother    Hypertension Mother    Breast cancer Mother    Macular degeneration Mother    Heart disease Mother    COPD Mother    Glaucoma Mother    Osteoarthritis Father    Glaucoma Father    Hypertension Father    Stroke Father    Parkinson's disease Father    Dementia Father    Colon cancer Neg Hx    Colon polyps Neg Hx    Esophageal cancer Neg Hx    Rectal cancer Neg Hx    Stomach cancer Neg Hx     Social History   Socioeconomic History   Marital status: Married    Spouse name: Not on file   Number of children: Not on file   Years of education: Not on file   Highest education level: Not on file  Occupational History   Not on file  Tobacco Use   Smoking status: Never    Passive exposure: Never   Smokeless tobacco: Never  Vaping Use   Vaping status: Never Used  Substance and Sexual Activity   Alcohol use: No   Drug use: Never   Sexual activity: Not on file  Other Topics Concern   Not on file  Social History Narrative   Not on file   Social Determinants of Health   Financial Resource Strain: Not on file  Food Insecurity: Not on file  Transportation Needs: Not on file  Physical Activity: Not on file  Stress: Not on file  Social Connections: Not on file  Intimate Partner Violence: Not on file    Review of Systems:  All other review of systems negative except as mentioned in the HPI.  Physical Exam: Vital signs in last 24 hours: BP 118/68   Pulse 78   Temp 98 F (36.7 C) (Temporal)   Ht 5' (1.524 m)   Wt 147 lb (66.7 kg)   SpO2 97%    BMI 28.71 kg/m  General:   Alert, NAD Lungs:  Clear .   Heart:  Regular rate and rhythm Abdomen:  Soft, nontender and nondistended. Neuro/Psych:  Alert and cooperative. Normal mood and affect. A and O x 3  Reviewed labs, radiology imaging, old records and pertinent past GI work up  Patient is appropriate for planned procedure(s) and anesthesia in an ambulatory setting   K. Scherry Ran , MD [phone]

## 2023-08-20 NOTE — Op Note (Signed)
[facility] Patient Name: Megan Tanner Procedure Date: 08/20/2023 8:10 AM MRN: [id number] Endoscopist: Napoleon Form , MD, [id number] Age: 57 Referring MD:  Date of Birth: Jan 19, 1966 Gender: Female Account #: [id number] Procedure:                Colonoscopy Indications:              High risk colon cancer surveillance: Personal                            history of adenoma less than 10 mm in size Medicines:                Monitored Anesthesia Care Procedure:                Pre-Anesthesia Assessment:                           - Prior to the procedure, a History and Physical                            was performed, and patient medications and                            allergies were reviewed. The patient's tolerance of                            previous anesthesia was also reviewed. The risks                            and benefits of the procedure and the sedation                            options and risks were discussed with the patient.                            All questions were answered, and informed consent                            was obtained. Prior Anticoagulants: The patient has                            taken no anticoagulant or antiplatelet agents. ASA                            Grade Assessment: II - A patient with mild systemic                            disease. After reviewing the risks and benefits,                            the patient was deemed in satisfactory condition to                            undergo the procedure.  After obtaining informed consent, the colonoscope                            was passed under direct vision. Throughout the                            procedure, the patient's blood pressure, pulse, and                            oxygen saturations were monitored continuously. The                            [id number]-HQ190L Colonoscope [id number] was introduced                            through the anus  and advanced to the the cecum,                            identified by appendiceal orifice and ileocecal                            valve. The colonoscopy was performed without                            difficulty. The patient tolerated the procedure                            well. The quality of the bowel preparation was                            good. The ileocecal valve, appendiceal orifice, and                            rectum were photographed. Scope In: 8:13:02 AM Scope Out: 8:34:43 AM Scope Withdrawal Time: 0 hours 14 minutes 9 seconds  Total Procedure Duration: 0 hours 21 minutes 41 seconds  Findings:                 The perianal and digital rectal examinations were                            normal.                           A 2 mm polyp was found in the cecum. The polyp was                            sessile. The polyp was removed with a cold snare.                            Resection and retrieval were complete.                           A few small-mouthed diverticula were found in the  sigmoid colon.                           Non-bleeding external and internal hemorrhoids were                            found during retroflexion. The hemorrhoids were                            small. Complications:            No immediate complications. Estimated Blood Loss:     Estimated blood loss was minimal. Impression:               - One 2 mm polyp in the cecum, removed with a cold                            snare. Resected and retrieved.                           - Diverticulosis in the sigmoid colon.                           - Non-bleeding external and internal hemorrhoids. Recommendation:           - Patient has a contact number available for                            emergencies. The signs and symptoms of potential                            delayed complications were discussed with the                            patient. Return to normal  activities tomorrow.                            Written discharge instructions were provided to the                            patient.                           - Resume previous diet.                           - Continue present medications.                           - Await pathology results.                           - Repeat colonoscopy in 5-7 years for surveillance                            based on pathology results. Napoleon Form, MD 08/20/2023 8:39:26 AM This report has been signed electronically.

## 2023-08-20 NOTE — Patient Instructions (Signed)
Discharge instructions given. Handouts on polyps,Diverticulosis and Hemorrhoids. Resume previous medications. YOU HAD AN ENDOSCOPIC PROCEDURE TODAY AT [facility]:   Refer to the procedure report that was given to you for any specific questions about what was found during the examination.  If the procedure report does not answer your questions, please call your gastroenterologist to clarify.  If you requested that your care partner not be given the details of your procedure findings, then the procedure report has been included in a sealed envelope for you to review at your convenience later.  YOU SHOULD EXPECT: Some feelings of bloating in the abdomen. Passage of more gas than usual.  Walking can help get rid of the air that was put into your GI tract during the procedure and reduce the bloating. If you had a lower endoscopy (such as a colonoscopy or flexible sigmoidoscopy) you may notice spotting of blood in your stool or on the toilet paper. If you underwent a bowel prep for your procedure, you may not have a normal bowel movement for a few days.  Please Note:  You might notice some irritation and congestion in your nose or some drainage.  This is from the oxygen used during your procedure.  There is no need for concern and it should clear up in a day or so.  SYMPTOMS TO REPORT IMMEDIATELY:  Following lower endoscopy (colonoscopy or flexible sigmoidoscopy):  Excessive amounts of blood in the stool  Significant tenderness or worsening of abdominal pains  Swelling of the abdomen that is new, acute  Fever of 100F or higher   For urgent or emergent issues, a gastroenterologist can be reached at any hour by calling [phone]. Do not use MyChart messaging for urgent concerns.    DIET:  We do recommend a small meal at first, but then you may proceed to your regular diet.  Drink plenty of fluids but you should avoid alcoholic beverages for 24 hours.  ACTIVITY:  You should  plan to take it easy for the rest of today and you should NOT DRIVE or use heavy machinery until tomorrow (because of the sedation medicines used during the test).    FOLLOW UP: Our staff will call the number listed on your records the next business day following your procedure.  We will call around 7:15- 8:00 am to check on you and address any questions or concerns that you may have regarding the information given to you following your procedure. If we do not reach you, we will leave a message.     If any biopsies were taken you will be contacted by phone or by letter within the next 1-3 weeks.  Please call us at [phone] if you have not heard about the biopsies in 3 weeks.    SIGNATURES/CONFIDENTIALITY: You and/or your care partner have signed paperwork which will be entered into your electronic medical record.  These signatures attest to the fact that that the information above on your After Visit Summary has been reviewed and is understood.  Full responsibility of the confidentiality of this discharge information lies with you and/or your care-partner.

## 2023-08-21 ENCOUNTER — Telehealth: Payer: Self-pay | Admitting: *Deleted

## 2023-08-21 NOTE — Telephone Encounter (Signed)
  Follow up Call-     08/20/2023    7:48 AM  Call back number  Post procedure Call Back phone  # [phone]  Permission to leave phone message Yes     Patient questions:  Do you have a fever, pain , or abdominal swelling? No. Pain Score  0 *  Have you tolerated food without any problems? Yes.    Have you been able to return to your normal activities? Yes.    Do you have any questions about your discharge instructions: Diet   No. Medications  No. Follow up visit  No.  Do you have questions or concerns about your Care? No.  Actions: * If pain score is 4 or above: No action needed, pain <4.

## 2023-08-22 LAB — SURGICAL PATHOLOGY

## 2023-08-24 ENCOUNTER — Other Ambulatory Visit (HOSPITAL_COMMUNITY): Payer: Self-pay

## 2023-08-24 ENCOUNTER — Other Ambulatory Visit: Payer: Self-pay

## 2023-09-03 ENCOUNTER — Encounter: Payer: Self-pay | Admitting: Gastroenterology

## 2023-09-05 ENCOUNTER — Other Ambulatory Visit: Payer: Self-pay

## 2023-09-07 ENCOUNTER — Other Ambulatory Visit: Payer: Self-pay

## 2023-09-07 MED ORDER — AMPHETAMINE-DEXTROAMPHETAMINE 30 MG PO TABS
30.0000 mg | ORAL_TABLET | Freq: Two times a day (BID) | ORAL | 0 refills | Status: DC
  Filled 2023-09-07: qty 60, 30d supply, fill #0

## 2023-09-10 ENCOUNTER — Other Ambulatory Visit: Payer: Self-pay

## 2023-09-14 ENCOUNTER — Other Ambulatory Visit: Payer: Self-pay

## 2023-09-17 ENCOUNTER — Other Ambulatory Visit (HOSPITAL_COMMUNITY)
Admission: RE | Admit: 2023-09-17 | Discharge: 2023-09-17 | Disposition: A | Discharge status: Home / Self Care | Payer: Self-pay | Source: Ambulatory Visit | Attending: Oncology | Admitting: Oncology

## 2023-09-17 DIAGNOSIS — Z006 Encounter for examination for normal comparison and control in clinical research program: Secondary | ICD-10-CM | POA: Insufficient documentation

## 2023-09-19 ENCOUNTER — Ambulatory Visit: Payer: 59 | Admitting: Internal Medicine

## 2023-09-20 ENCOUNTER — Other Ambulatory Visit: Payer: Self-pay

## 2023-09-25 ENCOUNTER — Encounter (HOSPITAL_COMMUNITY): Payer: Self-pay

## 2023-09-25 ENCOUNTER — Other Ambulatory Visit (HOSPITAL_COMMUNITY): Payer: Self-pay

## 2023-09-25 ENCOUNTER — Other Ambulatory Visit: Payer: Self-pay

## 2023-09-25 MED ORDER — HYDROCHLOROTHIAZIDE 25 MG PO TABS
25.0000 mg | ORAL_TABLET | Freq: Every morning | ORAL | 1 refills | Status: DC
  Filled 2023-09-25 (×2): qty 90, 90d supply, fill #0
  Filled 2023-10-05 – 2023-12-18 (×2): qty 90, 90d supply, fill #1

## 2023-09-28 LAB — GENECONNECT MOLECULAR SCREEN: Genetic Analysis Overall Interpretation: NEGATIVE

## 2023-10-04 ENCOUNTER — Other Ambulatory Visit (HOSPITAL_COMMUNITY): Payer: Self-pay

## 2023-10-04 ENCOUNTER — Other Ambulatory Visit: Payer: Self-pay

## 2023-10-04 DIAGNOSIS — J0100 Acute maxillary sinusitis, unspecified: Secondary | ICD-10-CM | POA: Diagnosis not present

## 2023-10-04 MED ORDER — AMOXICILLIN-POT CLAVULANATE 875-125 MG PO TABS
1.0000 | ORAL_TABLET | Freq: Two times a day (BID) | ORAL | 0 refills | Status: DC
  Filled 2023-10-04: qty 20, 10d supply, fill #0

## 2023-10-04 NOTE — Progress Notes (Signed)
Office Visit Note  Patient: Megan Tanner             Date of Birth: 1966-08-01           MRN: [id number]             PCP: Noberto Retort, MD Referring: Noberto Retort, MD Visit Date: 10/17/2023   Subjective:  Follow-up   Discussed the use of AI scribe software for clinical note transcription with the patient, who gave verbal consent to proceed.  History of Present Illness   Megan Tanner is a 57 y.o. female here for follow up for psoriatic arthritis on hydroxychloroquine for 400 mg daily presents with worsening joint pain, particularly in the wrists, thumbs, neck, and hips. They report a history of a broken wrist, which is now experiencing similar pain, along with cracking and popping sensations. The patient also describes a radiating pain from the knee to the hip, suspected to be sciatic nerve-related. They have received a cortisone injection in the right knee a couple of months ago.  The patient also reports dry eyes and mouth. They also report difficulty sleeping due to pain, waking up every two hours. They have tried gabapentin in the past but discontinued due to dizziness. They have also tried Cymbalta for fibromyalgia but did not notice a significant difference in pain levels.  The patient also mentions a recent sinus infection, for which they completed a course of Augmentin. They have tried taking turmeric supplements but discontinued due to nausea and vomiting. They also report worsening nail conditions, with toenails raising up and causing holes in socks.    Previous HPI 06/11/2023 Megan Tanner is a 58 y.o. female here for follow up for psoriatic arthritis on hydroxychloroquine for 400 mg daily and Tremfya 100 mg every 8 weeks since starting in February.  Currently feels symptoms are not well-controlled.  Pain is worse after activity and severely limiting her exercise.  She thought there was initial benefit in joint pain and stiffness with the start of Tremfya and  definite decrease in skin rashes.  However after the first 2 doses feels the symptoms were not getting back more to at baseline. She had breakout of painful rash on her right breast similar to previous shingles at this area. Subsequently also with some rash around the eyes that was new not particularly associated by timing with the Tremfya injections.  Currently having joint pain most regularly in her fingers and the anterior portion of both feet.  Right knee pain wearing a brace for this had steroid injection with partial benefit.   Previous HPI 03/14/2023 Megan Tanner is a 57 y.o. female here for follow up for psoriatic arthritis on hydroxychloroquine for 400 mg daily and after starting Tremfya 100 mg in February now at maintenance scheduled every 8 weeks.  Within the first month after starting she saw clearance of skin rashes on her elbows and groin area completely.  Has still had persistent joint issues had x-ray and a steroid injection in the right shoulder.  Also had a Synvisc injection in the right knee about a month ago for osteoarthritis.  Still has some persistent pain in that area specially has to take caution with climbing stairs and is wearing knee braces for support.  Also with some right ankle pain.  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  Previous HPI 12/13/22 Megan Tanner is a 57 y.o. female here for follow up for inflammatory arthritis on HCQ 400 mg daily and ibuprofen 800 mg PRN. Symptoms remain about the same as last time. Lab testing was all negative except mildly elevated cholesterol. 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    Previous HPI 11/08/22 Megan Tanner is a 57 y.o. female here for second opinion for psoriatic arthritis. She was previously seeing Dr. Kathi Ludwig for management including plaquenil 400 mg daily. She has  taken multiple previous treatments most recently Orencia with incomplete symptom improvement.  She is also using ibuprofen 800 mg as needed for breakthrough inflammatory and osteoarthritis pain.  She has tried several longer acting or prescription or topical NSAIDs all of which were not as effective as the high-dose ibuprofen. She has been having at least 5 or 6 years of persistent pain affecting her hands with worsening bony nodules with episodic redness swelling and nail changes. She has fingernail ridges with some nail discoloration and separation in the great toe.  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  So far oral and topical medications have not improved these rashes.  They are not painful or itchy. She has had a chronic injury to the right knee that occurred falling down stairs and sees Dr. Madelon Lips for long-term management has had MRIs but trying to avoid surgery.  Also has received previous viscosupplementation injection series. She did not have great benefit from these. Steroid shots have been helpful for at least a month or more but not especially long either.    DMARD Hx Methotrexate - Rashes Leflunomide - Intolerance Humira - Incomplete response Remicade - Incomplete response Xeljanz - Nonresponder Cosentyx - Nonresponder Orencia - Nonresponder   Review of Systems  Constitutional:  Positive for fatigue.  HENT:  Positive for mouth sores and mouth dryness.   Eyes:  Positive for dryness.  Respiratory:  Negative for shortness of breath.   Cardiovascular:  Negative for chest pain and palpitations.  Gastrointestinal:  Negative for blood in stool, constipation and diarrhea.  Endocrine: Negative for increased urination.  Genitourinary:  Negative for involuntary urination.  Musculoskeletal:  Positive for joint pain, joint pain, joint swelling, myalgias, morning  stiffness, muscle tenderness and myalgias. Negative for gait problem and muscle weakness.  Skin:  Positive for rash and sensitivity to sunlight. Negative for color change and hair loss.  Allergic/Immunologic: Positive for susceptible to infections.  Neurological:  Positive for dizziness and headaches.  Hematological:  Negative for swollen glands.  Psychiatric/Behavioral:  Positive for depressed mood and sleep disturbance. The patient is not nervous/anxious.     PMFS History:  Patient Active Problem List   Diagnosis Date Noted   High risk medication use 12/13/2022   Chronic fatigue 07/06/2020   Fibromyalgia affecting multiple sites 07/06/2020   Hypertension 07/06/2020   IBS (irritable bowel syndrome) 07/06/2020   Migraines 07/06/2020   Osteoarthritis 07/06/2020   Psoriatic arthritis (HCC) 07/06/2020   Restless leg syndrome 07/06/2020    Past Medical History:  Diagnosis Date   Allergy    Arthritis    Dry eyes    Dry mouth    Fatigue    Fibromyalgia    Hypertension    IBS (irritable bowel syndrome)    Migraines    Seizures (HCC)    as  a baby  from fever    Family History  Problem Relation Age of Onset   Osteoarthritis Mother    Hypertension Mother    Breast cancer Mother    Macular degeneration Mother    Heart disease Mother    COPD Mother    Glaucoma Mother    Osteoarthritis Father    Glaucoma Father    Hypertension Father    Stroke Father    Parkinson's disease Father    Dementia Father    Colon cancer Neg Hx    Colon polyps Neg Hx    Esophageal cancer Neg Hx    Rectal cancer Neg Hx    Stomach cancer Neg Hx    Past Surgical History:  Procedure Laterality Date   ABDOMINAL HYSTERECTOMY     c sections     1990 1993   COLONOSCOPY     KNEE SURGERY     KNEE SURGERY Right     has had 6 knee surgery   SHOULDER SURGERY     Social History   Social History Narrative   Not on file   Immunization History  Administered Date(s) Administered   PFIZER(Purple  Top)SARS-COV-2 Vaccination 10/28/2019, 11/24/2019     Objective: Vital Signs: BP 129/85 (BP Location: Left Arm, Patient Position: Sitting, Cuff Size: Normal)   Pulse 90   Resp 14   Ht 5' (1.524 m)   Wt 151 lb (68.5 kg)   BMI 29.49 kg/m    Physical Exam Eyes:     Conjunctiva/sclera: Conjunctivae normal.  Cardiovascular:     Rate and Rhythm: Normal rate and regular rhythm.  Pulmonary:     Effort: Pulmonary effort is normal.     Breath sounds: Normal breath sounds.  Lymphadenopathy:     Cervical: No cervical adenopathy.  Skin:    General: Skin is warm and dry.  Neurological:     Mental Status: She is alert.  Psychiatric:        Mood and Affect: Mood normal.      Musculoskeletal Exam:  Shoulders full ROM pain and in upper back with overhead abduction Elbows full ROM no tenderness or swelling Wrists full ROM no tenderness or swelling Fingers full ROM, tenderness right 3rd DIP, DIP heberdon's nodes with mild flexion deformities, 1st CMC joint tenderness, no palpable swelling Knees full ROM medial joint line tenderness to pressure   Investigation: No additional findings.  Imaging: No results found.  Recent Labs: Lab Results  Component Value Date   WBC 7.1 03/14/2023   HGB 15.8 (H) 03/14/2023   PLT 312 03/14/2023   NA 143 06/11/2023   K 3.9 06/11/2023   CL 102 06/11/2023   CO2 31 06/11/2023   GLUCOSE 95 06/11/2023   BUN 19 06/11/2023   CREATININE 0.78 06/11/2023   BILITOT 0.4 03/14/2023   AST 17 03/14/2023   ALT 20 03/14/2023   PROT 7.0 03/14/2023   CALCIUM 10.1 06/11/2023   QFTBGOLDPLUS NEGATIVE 11/08/2022    Speciality Comments: PLQ Eye Exam: 04/25/2023 Normal  @[facility]  Ophthalmology f/u 1 year  Procedures:  No procedures performed Allergies: Codeine, Erythromycin, Fluticasone, Lisinopril, Methotrexate, Sulfa antibiotics, Sumatriptan, Tramadol, and Tyloxapol   Assessment / Plan:     Visit Diagnoses: Psoriatic arthritis (HCC) - ibuprofen 800 mg  2-3 times daily - Plan: predniSONE (DELTASONE) 5 MG tablet Mild recurrence of rash and itching since discontinuation of Tremfya. Dry eyes and mouth also noted since discontinuation. -Continue Plaquenil as it has been beneficial in the past. -Consider reinitiating Tremfya or  exploring other treatment options for joint inflammation.  Primary osteoarthritis involving multiple joints - Tremfya will stop this for now and monitor symptoms. Worsening joint pain, particularly in the wrists, thumbs, neck, and hips. Pain is affecting sleep and daily activities. Previous trial of Cymbalta without significant improvement. Gabapentin not tolerated due to dizziness. -Will add prednisone 5 mg daily to manage symptoms, particularly during the winter months when symptoms are typically worse. -Continue ibuprofen 800 mg up to TID PRN for OA  High risk medication use - hydroxychloroquine 400 mg daily. PLQ Eye Exam: 04/25/2023 Normal  Fibromyalgia affecting multiple sites  Orders: No orders of the defined types were placed in this encounter.  Meds ordered this encounter  Medications   predniSONE (DELTASONE) 5 MG tablet    Sig: Take 1 tablet (5 mg total) by mouth daily with breakfast.    Dispense:  90 tablet    Refill:  0     Follow-Up Instructions: Return in about 3 months (around 01/15/2024) for OA/PsA on HCQ/NSAID/GC f/u 3mos.   Fuller Plan, MD  Note - This record has been created using [employer].  Chart creation errors have been sought, but may not always  have been located. Such creation errors do not reflect on  the standard of medical care.

## 2023-10-05 ENCOUNTER — Other Ambulatory Visit: Payer: Self-pay

## 2023-10-06 IMAGING — MG MM DIGITAL SCREENING BILAT W/ TOMO AND CAD
8 series · 8 of 24 positions shown · non-contrast
Comparison: Previous exam(s).

CLINICAL DATA: Screening.

EXAM:
DIGITAL SCREENING BILATERAL MAMMOGRAM WITH TOMOSYNTHESIS AND CAD
TECHNIQUE: Bilateral screening digital craniocaudal and mediolateral oblique
mammograms were obtained. Bilateral screening digital breast
tomosynthesis was performed. The images were evaluated with
computer-aided detection.

[R MLO synth-2D]
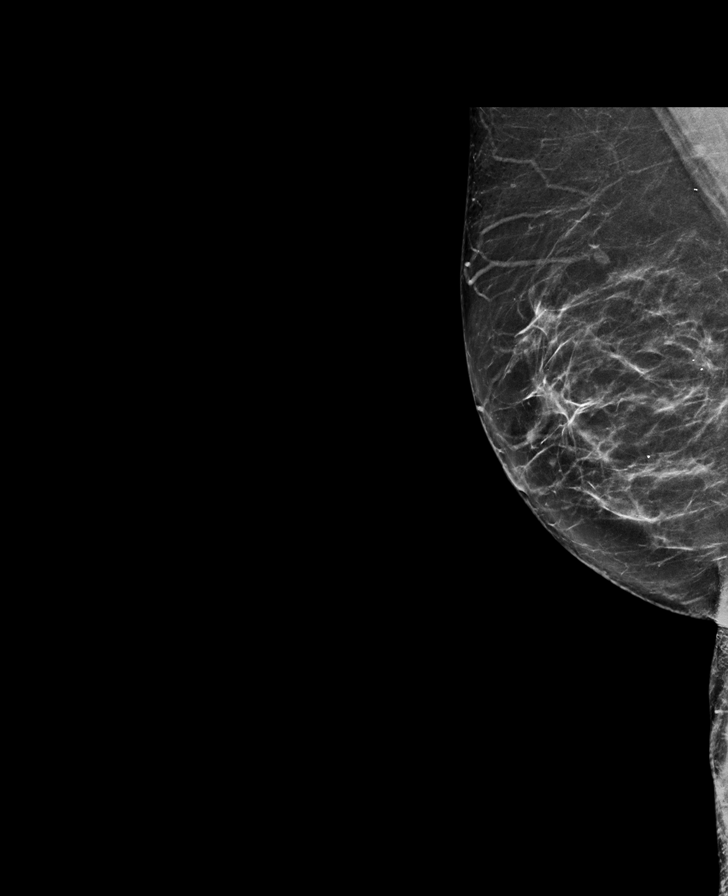

[L CC synth-2D]
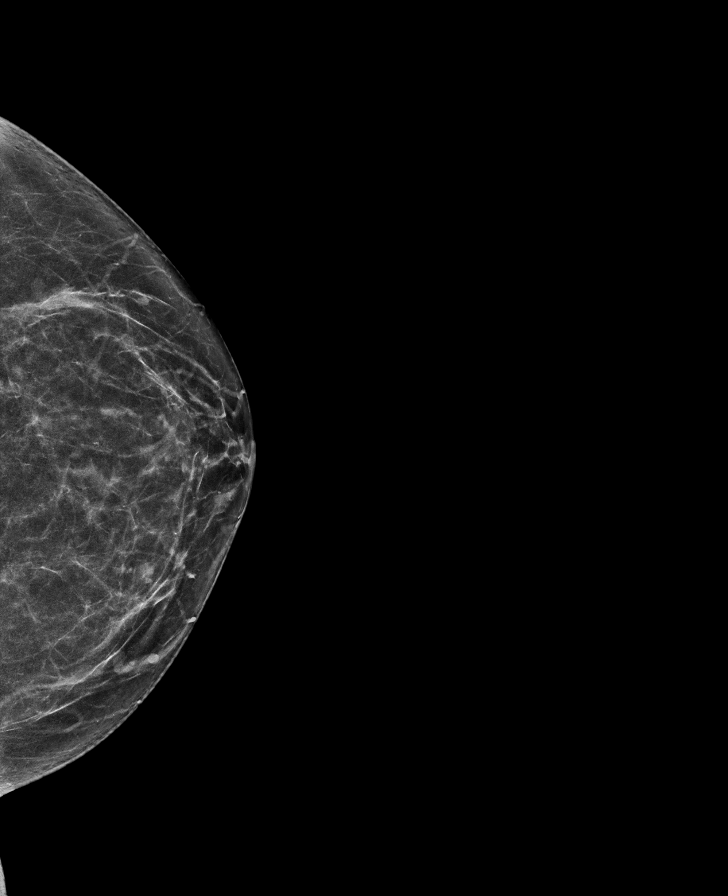

[L MLO synth-2D]
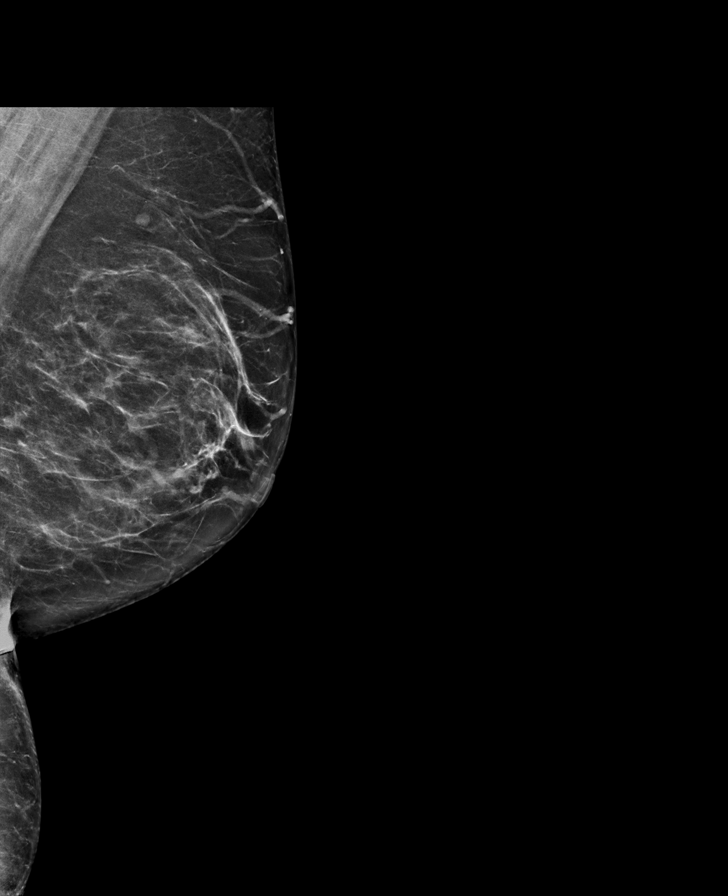

[R CC synth-2D]
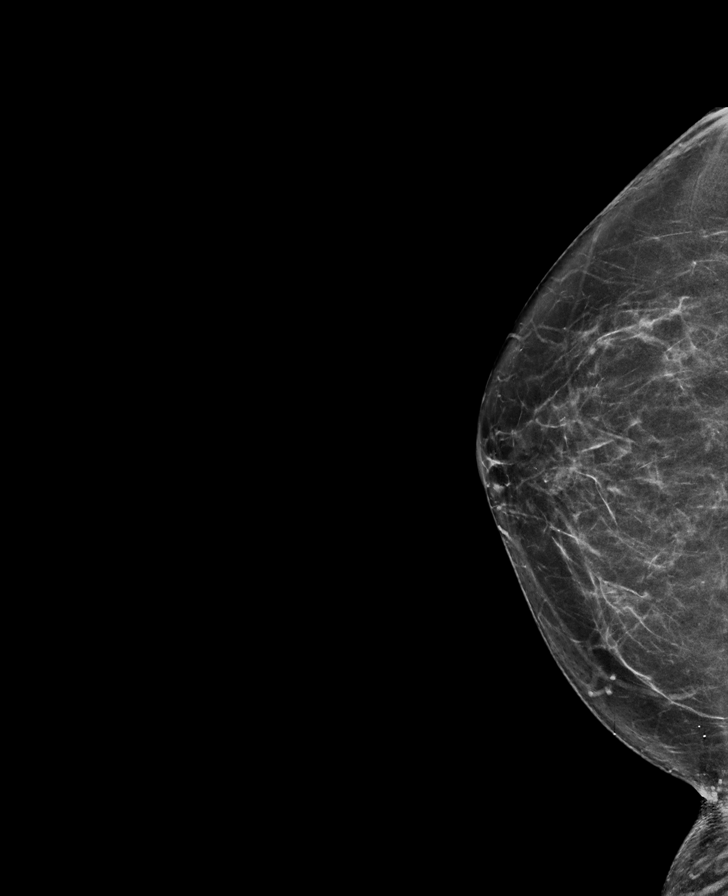

[R MLO tomo · tomo slice 37/74.0]
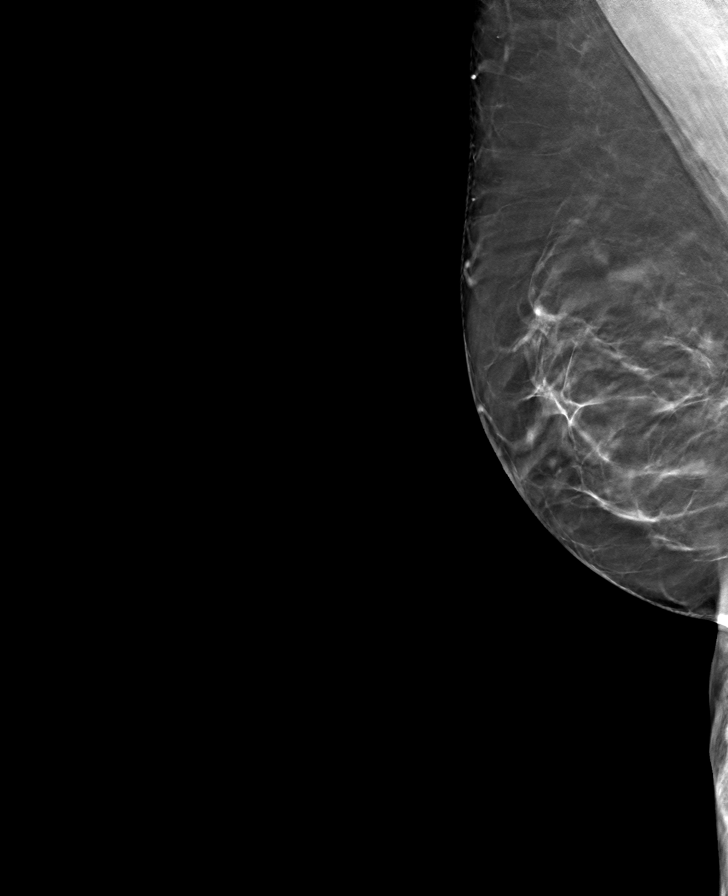

[R CC tomo · tomo slice 35/70.0]
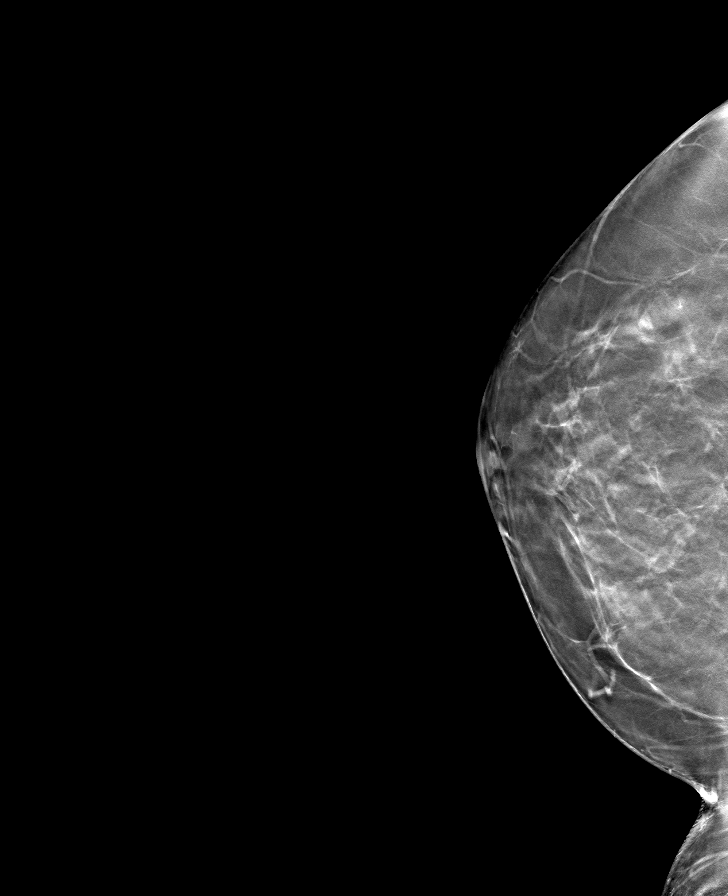

[L CC tomo · tomo slice 33/65.0]
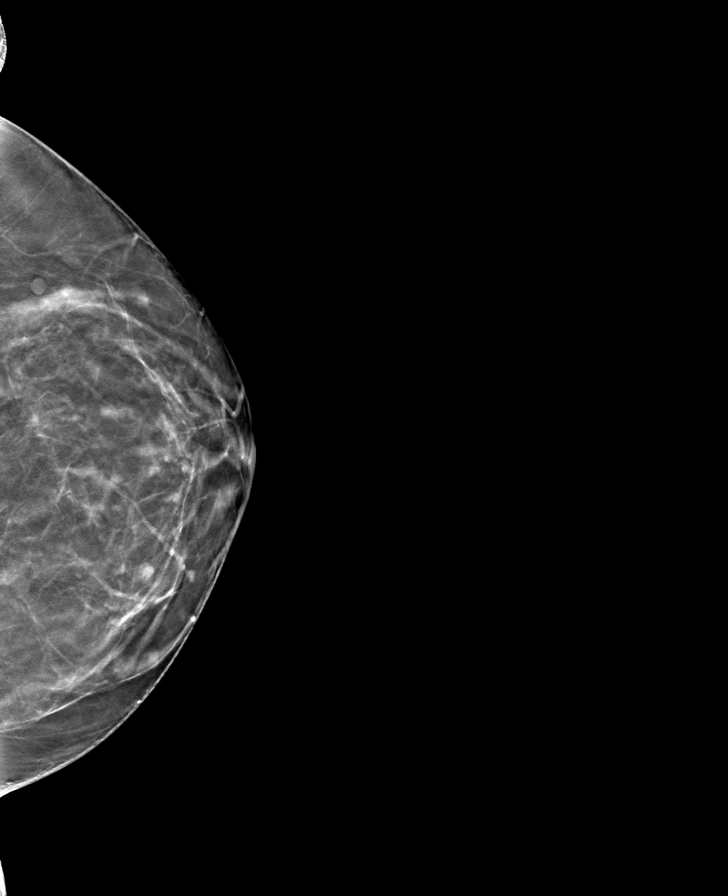

[L MLO tomo · tomo slice 36/71.0]
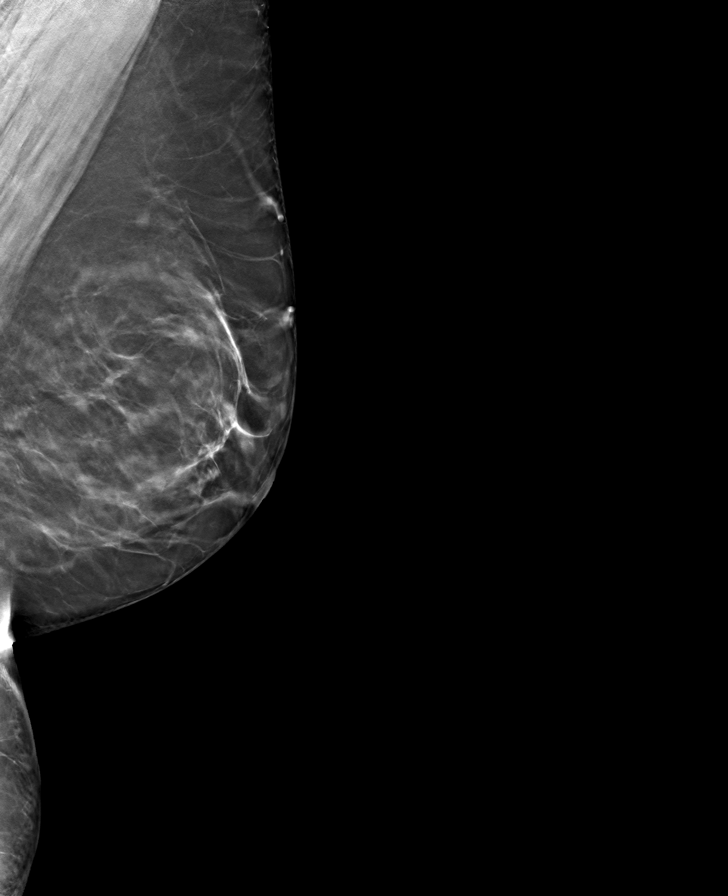

[8 of 24 positions shown; findings below may reference images not displayed]

ACR Breast Density Category b: There are scattered areas of
fibroglandular density.
FINDINGS: There are no findings suspicious for malignancy.
IMPRESSION: No mammographic evidence of malignancy. A result letter of this
screening mammogram will be mailed directly to the patient.

RECOMMENDATION:
Screening mammogram in one year. (Code:51-O-LD2)

BI-RADS CATEGORY  1: Negative.

## 2023-10-13 ENCOUNTER — Other Ambulatory Visit: Payer: Self-pay

## 2023-10-17 ENCOUNTER — Ambulatory Visit: Payer: 59 | Attending: Internal Medicine | Admitting: Internal Medicine

## 2023-10-17 ENCOUNTER — Other Ambulatory Visit: Payer: Self-pay

## 2023-10-17 ENCOUNTER — Encounter: Payer: Self-pay | Admitting: Internal Medicine

## 2023-10-17 ENCOUNTER — Other Ambulatory Visit (HOSPITAL_COMMUNITY): Payer: Self-pay

## 2023-10-17 VITALS — BP 129/85 | HR 90 | Resp 14 | Ht 60.0 in | Wt 151.0 lb

## 2023-10-17 DIAGNOSIS — M069 Rheumatoid arthritis, unspecified: Secondary | ICD-10-CM

## 2023-10-17 DIAGNOSIS — M797 Fibromyalgia: Secondary | ICD-10-CM

## 2023-10-17 DIAGNOSIS — Z79899 Other long term (current) drug therapy: Secondary | ICD-10-CM | POA: Diagnosis not present

## 2023-10-17 DIAGNOSIS — M150 Primary generalized (osteo)arthritis: Secondary | ICD-10-CM

## 2023-10-17 DIAGNOSIS — L4050 Arthropathic psoriasis, unspecified: Secondary | ICD-10-CM

## 2023-10-17 MED ORDER — PREDNISONE 5 MG PO TABS
5.0000 mg | ORAL_TABLET | Freq: Every day | ORAL | 0 refills | Status: DC
  Filled 2023-10-17: qty 90, 90d supply, fill #0

## 2023-10-17 MED ORDER — HYDROXYCHLOROQUINE SULFATE 200 MG PO TABS
400.0000 mg | ORAL_TABLET | Freq: Every day | ORAL | 0 refills | Status: DC
  Filled 2023-10-17 – 2024-01-16 (×2): qty 180, 90d supply, fill #0

## 2023-10-18 ENCOUNTER — Other Ambulatory Visit (HOSPITAL_COMMUNITY): Payer: Self-pay

## 2023-10-22 ENCOUNTER — Other Ambulatory Visit (HOSPITAL_COMMUNITY): Payer: Self-pay

## 2023-10-22 ENCOUNTER — Other Ambulatory Visit: Payer: Self-pay

## 2023-10-22 ENCOUNTER — Encounter: Payer: Self-pay | Admitting: Pharmacist

## 2023-10-22 MED ORDER — CEFUROXIME AXETIL 500 MG PO TABS
500.0000 mg | ORAL_TABLET | Freq: Two times a day (BID) | ORAL | 0 refills | Status: DC
  Filled 2023-10-22 – 2023-10-30 (×2): qty 14, 7d supply, fill #0

## 2023-10-25 ENCOUNTER — Other Ambulatory Visit: Payer: Self-pay

## 2023-10-29 ENCOUNTER — Other Ambulatory Visit (HOSPITAL_COMMUNITY): Payer: Self-pay

## 2023-10-29 ENCOUNTER — Other Ambulatory Visit: Payer: Self-pay

## 2023-10-30 ENCOUNTER — Other Ambulatory Visit: Payer: Self-pay

## 2023-10-30 ENCOUNTER — Other Ambulatory Visit (HOSPITAL_BASED_OUTPATIENT_CLINIC_OR_DEPARTMENT_OTHER): Payer: Self-pay

## 2023-11-01 ENCOUNTER — Other Ambulatory Visit: Payer: Self-pay | Admitting: Internal Medicine

## 2023-11-01 DIAGNOSIS — L4050 Arthropathic psoriasis, unspecified: Secondary | ICD-10-CM

## 2023-11-02 ENCOUNTER — Other Ambulatory Visit (HOSPITAL_COMMUNITY): Payer: Self-pay

## 2023-11-02 ENCOUNTER — Other Ambulatory Visit: Payer: Self-pay

## 2023-11-08 ENCOUNTER — Other Ambulatory Visit (HOSPITAL_COMMUNITY): Payer: Self-pay

## 2023-11-08 ENCOUNTER — Other Ambulatory Visit: Payer: Self-pay

## 2023-11-08 MED ORDER — DOXYCYCLINE HYCLATE 100 MG PO TABS
100.0000 mg | ORAL_TABLET | Freq: Two times a day (BID) | ORAL | 0 refills | Status: AC
  Filled 2023-11-08: qty 14, 7d supply, fill #0

## 2023-11-14 ENCOUNTER — Ambulatory Visit: Payer: 59 | Admitting: Internal Medicine

## 2023-11-16 ENCOUNTER — Other Ambulatory Visit: Payer: Self-pay

## 2023-11-17 ENCOUNTER — Other Ambulatory Visit (HOSPITAL_COMMUNITY): Payer: Self-pay

## 2023-11-20 ENCOUNTER — Other Ambulatory Visit: Payer: Self-pay

## 2023-11-21 ENCOUNTER — Other Ambulatory Visit (HOSPITAL_COMMUNITY): Payer: Self-pay

## 2023-11-21 ENCOUNTER — Other Ambulatory Visit: Payer: Self-pay

## 2023-11-21 MED ORDER — AMPHETAMINE-DEXTROAMPHETAMINE 30 MG PO TABS
30.0000 mg | ORAL_TABLET | Freq: Two times a day (BID) | ORAL | 0 refills | Status: DC
  Filled 2023-11-21 – 2024-03-03 (×2): qty 60, 30d supply, fill #0

## 2023-11-21 MED ORDER — AMPHETAMINE-DEXTROAMPHETAMINE 30 MG PO TABS
30.0000 mg | ORAL_TABLET | Freq: Two times a day (BID) | ORAL | 0 refills | Status: DC
  Filled 2023-11-21: qty 60, 30d supply, fill #0

## 2023-11-21 MED ORDER — AMPHETAMINE-DEXTROAMPHETAMINE 30 MG PO TABS
30.0000 mg | ORAL_TABLET | Freq: Two times a day (BID) | ORAL | 0 refills | Status: DC
  Filled 2023-11-21 – 2023-12-21 (×3): qty 60, 30d supply, fill #0

## 2023-11-23 ENCOUNTER — Other Ambulatory Visit: Payer: Self-pay

## 2023-11-25 ENCOUNTER — Other Ambulatory Visit (HOSPITAL_COMMUNITY): Payer: Self-pay

## 2023-12-14 ENCOUNTER — Other Ambulatory Visit: Payer: Self-pay

## 2023-12-14 MED ORDER — ADACEL 5-2-15.5 LF-MCG/0.5 IM SUSP
0.5000 mL | Freq: Once | INTRAMUSCULAR | 0 refills | Status: AC
  Filled 2023-12-14: qty 0.5, 1d supply, fill #0

## 2023-12-19 ENCOUNTER — Other Ambulatory Visit (HOSPITAL_COMMUNITY): Payer: Self-pay

## 2023-12-19 ENCOUNTER — Other Ambulatory Visit: Payer: Self-pay

## 2023-12-21 ENCOUNTER — Other Ambulatory Visit: Payer: Self-pay

## 2023-12-21 ENCOUNTER — Other Ambulatory Visit (HOSPITAL_COMMUNITY): Payer: Self-pay

## 2024-01-02 ENCOUNTER — Other Ambulatory Visit (HOSPITAL_COMMUNITY): Payer: Self-pay

## 2024-01-02 ENCOUNTER — Other Ambulatory Visit: Payer: Self-pay | Admitting: Family Medicine

## 2024-01-02 DIAGNOSIS — R0981 Nasal congestion: Secondary | ICD-10-CM

## 2024-01-02 DIAGNOSIS — M797 Fibromyalgia: Secondary | ICD-10-CM | POA: Diagnosis not present

## 2024-01-02 DIAGNOSIS — F900 Attention-deficit hyperactivity disorder, predominantly inattentive type: Secondary | ICD-10-CM | POA: Diagnosis not present

## 2024-01-02 DIAGNOSIS — I10 Essential (primary) hypertension: Secondary | ICD-10-CM | POA: Diagnosis not present

## 2024-01-02 DIAGNOSIS — K582 Mixed irritable bowel syndrome: Secondary | ICD-10-CM | POA: Diagnosis not present

## 2024-01-02 DIAGNOSIS — J4520 Mild intermittent asthma, uncomplicated: Secondary | ICD-10-CM | POA: Diagnosis not present

## 2024-01-02 DIAGNOSIS — F324 Major depressive disorder, single episode, in partial remission: Secondary | ICD-10-CM | POA: Diagnosis not present

## 2024-01-02 DIAGNOSIS — L4050 Arthropathic psoriasis, unspecified: Secondary | ICD-10-CM | POA: Diagnosis not present

## 2024-01-02 MED ORDER — HYDROCHLOROTHIAZIDE 25 MG PO TABS
25.0000 mg | ORAL_TABLET | Freq: Every morning | ORAL | 1 refills | Status: DC
  Filled 2024-03-27: qty 90, 90d supply, fill #0
  Filled 2024-06-21: qty 90, 90d supply, fill #1

## 2024-01-02 MED ORDER — BUPROPION HCL ER (XL) 300 MG PO TB24
300.0000 mg | ORAL_TABLET | Freq: Every morning | ORAL | 1 refills | Status: DC
Start: 2024-01-02 — End: 2024-07-18
  Filled 2024-01-02 – 2024-01-16 (×2): qty 90, 90d supply, fill #0
  Filled 2024-04-19: qty 90, 90d supply, fill #1

## 2024-01-02 NOTE — Progress Notes (Deleted)
 Office Visit Note  Patient: Megan Tanner             Date of Birth: 08/24/1966           MRN: [id number]             PCP: Noberto Retort, MD Referring: Noberto Retort, MD Visit Date: 01/16/2024   Subjective:  No chief complaint on file.   History of Present Illness: Megan Tanner is a 58 y.o. female here for follow up for psoriatic arthritis on hydroxychloroquine for 400 mg daily presents with worsening joint pain, particularly in the wrists, thumbs, neck, and hips.    Previous HPI 10/17/2023 Megan Tanner is a 58 y.o. female here for follow up for psoriatic arthritis on hydroxychloroquine for 400 mg daily presents with worsening joint pain, particularly in the wrists, thumbs, neck, and hips. They report a history of a broken wrist, which is now experiencing similar pain, along with cracking and popping sensations. The patient also describes a radiating pain from the knee to the hip, suspected to be sciatic nerve-related. They have received a cortisone injection in the right knee a couple of months ago.   The patient also reports dry eyes and mouth. They also report difficulty sleeping due to pain, waking up every two hours. They have tried gabapentin in the past but discontinued due to dizziness. They have also tried Cymbalta for fibromyalgia but did not notice a significant difference in pain levels.   The patient also mentions a recent sinus infection, for which they completed a course of Augmentin. They have tried taking turmeric supplements but discontinued due to nausea and vomiting. They also report worsening nail conditions, with toenails raising up and causing holes in socks.      Previous HPI 06/11/2023 Megan Tanner is a 58 y.o. female here for follow up for psoriatic arthritis on hydroxychloroquine for 400 mg daily and Tremfya 100 mg every 8 weeks since starting in February.  Currently feels symptoms are not well-controlled.  Pain is worse after activity and  severely limiting her exercise.  She thought there was initial benefit in joint pain and stiffness with the start of Tremfya and definite decrease in skin rashes.  However after the first 2 doses feels the symptoms were not getting back more to at baseline. She had breakout of painful rash on her right breast similar to previous shingles at this area. Subsequently also with some rash around the eyes that was new not particularly associated by timing with the Tremfya injections.  Currently having joint pain most regularly in her fingers and the anterior portion of both feet.  Right knee pain wearing a brace for this had steroid injection with partial benefit.   Previous HPI 03/14/2023 Megan Tanner is a 58 y.o. female here for follow up for psoriatic arthritis on hydroxychloroquine for 400 mg daily and after starting Tremfya 100 mg in February now at maintenance scheduled every 8 weeks.  Within the first month after starting she saw clearance of skin rashes on her elbows and groin area completely.  Has still had persistent joint issues had x-ray and a steroid injection in the right shoulder.  Also had a Synvisc injection in the right knee about a month ago for osteoarthritis.  Still has some persistent pain in that area specially has to take caution with climbing stairs and is wearing knee braces for support.  Also with some right ankle pain.  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   Previous HPI 12/13/22 Megan Tanner is a 58 y.o. female here for follow up for inflammatory arthritis on HCQ 400 mg daily and ibuprofen 800 mg PRN. Symptoms remain about the same as last time. Lab testing was all negative except mildly elevated cholesterol. 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    Previous HPI 11/08/22 Megan Tanner is a 58 y.o. female here for  second opinion for psoriatic arthritis. She was previously seeing Dr. Kathi Ludwig for management including plaquenil 400 mg daily. She has taken multiple previous treatments most recently Orencia with incomplete symptom improvement.  She is also using ibuprofen 800 mg as needed for breakthrough inflammatory and osteoarthritis pain.  She has tried several longer acting or prescription or topical NSAIDs all of which were not as effective as the high-dose ibuprofen. She has been having at least 5 or 6 years of persistent pain affecting her hands with worsening bony nodules with episodic redness swelling and nail changes. She has fingernail ridges with some nail discoloration and separation in the great toe.  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  So far oral and topical medications have not improved these rashes.  They are not painful or itchy. She has had a chronic injury to the right knee that occurred falling down stairs and sees Dr. Madelon Lips for long-term management has had MRIs but trying to avoid surgery.  Also has received previous viscosupplementation injection series. She did not have great benefit from these. Steroid shots have been helpful for at least a month or more but not especially long either.    DMARD Hx Methotrexate - Rashes Leflunomide - Intolerance Humira - Incomplete response Remicade - Incomplete response Xeljanz - Nonresponder Cosentyx - Nonresponder Orencia - Nonresponder   Review of Systems  Constitutional:  Positive for fatigue.  HENT:  Positive for mouth sores and mouth dryness.   Eyes:  Positive for dryness.  Respiratory:  Negative for shortness of breath.   Cardiovascular:  Negative for chest pain and palpitations.  Gastrointestinal:  Negative for blood in stool, constipation and diarrhea.  Endocrine: Negative for increased urination.  Genitourinary:   Negative for involuntary urination.  Musculoskeletal:  Positive for joint pain, joint pain, joint swelling, myalgias, morning stiffness, muscle tenderness and myalgias. Negative for gait problem and muscle weakness.  Skin:  Positive for hair loss. Negative for color change, rash and sensitivity to sunlight.  Allergic/Immunologic: Negative for susceptible to infections.  Neurological:  Positive for dizziness and headaches.  Hematological:  Negative for swollen glands.  Psychiatric/Behavioral:  Positive for depressed mood and sleep disturbance. The patient is not nervous/anxious.     PMFS History:  Patient Active Problem List   Diagnosis Date Noted   High risk medication use 12/13/2022   Chronic fatigue 07/06/2020   Fibromyalgia affecting multiple sites 07/06/2020   Hypertension 07/06/2020   IBS (irritable bowel syndrome) 07/06/2020   Migraines 07/06/2020   Osteoarthritis 07/06/2020   Psoriatic arthritis (HCC) 07/06/2020   Restless leg syndrome 07/06/2020    Past Medical History:  Diagnosis Date   Allergy    Arthritis    Dry eyes    Dry mouth    Fatigue    Fibromyalgia  Hypertension    IBS (irritable bowel syndrome)    Migraines    Seizures (HCC)    as  a baby from fever    Family History  Problem Relation Age of Onset   Osteoarthritis Mother    Hypertension Mother    Breast cancer Mother    Macular degeneration Mother    Heart disease Mother    COPD Mother    Glaucoma Mother    Osteoarthritis Father    Glaucoma Father    Hypertension Father    Stroke Father    Parkinson's disease Father    Dementia Father    Colon cancer Neg Hx    Colon polyps Neg Hx    Esophageal cancer Neg Hx    Rectal cancer Neg Hx    Stomach cancer Neg Hx    Past Surgical History:  Procedure Laterality Date   ABDOMINAL HYSTERECTOMY     c sections     1990 1993   COLONOSCOPY     KNEE SURGERY     KNEE SURGERY Right     has had 6 knee surgery   SHOULDER SURGERY     Social History    Social History Narrative   Not on file   Immunization History  Administered Date(s) Administered   PFIZER(Purple Top)SARS-COV-2 Vaccination 10/28/2019, 11/24/2019   Tdap 12/14/2023     Objective: Vital Signs: There were no vitals taken for this visit.   Physical Exam   Musculoskeletal Exam: ***  CDAI Exam: CDAI Score: -- Patient Global: --; Provider Global: -- Swollen: --; Tender: -- Joint Exam 01/16/2024   No joint exam has been documented for this visit   There is currently no information documented on the homunculus. Go to the Rheumatology activity and complete the homunculus joint exam.  Investigation: No additional findings.  Imaging: No results found.  Recent Labs: Lab Results  Component Value Date   WBC 7.1 03/14/2023   HGB 15.8 (H) 03/14/2023   PLT 312 03/14/2023   NA 143 06/11/2023   K 3.9 06/11/2023   CL 102 06/11/2023   CO2 31 06/11/2023   GLUCOSE 95 06/11/2023   BUN 19 06/11/2023   CREATININE 0.78 06/11/2023   BILITOT 0.4 03/14/2023   AST 17 03/14/2023   ALT 20 03/14/2023   PROT 7.0 03/14/2023   CALCIUM 10.1 06/11/2023   QFTBGOLDPLUS NEGATIVE 11/08/2022    Speciality Comments: PLQ Eye Exam: 04/25/2023 Normal  @[place]  Ophthalmology f/u 1 year  Procedures:  No procedures performed Allergies: Codeine, Erythromycin, Fluticasone, Lisinopril, Methotrexate, Sulfa antibiotics, Sumatriptan, Tramadol, and Tyloxapol   Assessment / Plan:     Visit Diagnoses: No diagnosis found.  ***  Orders: No orders of the defined types were placed in this encounter.  No orders of the defined types were placed in this encounter.    Follow-Up Instructions: No follow-ups on file.   Metta Clines, RT  Note - This record has been created using [employer].  Chart creation errors have been sought, but may not always  have been located. Such creation errors do not reflect on  the standard of medical care.

## 2024-01-03 ENCOUNTER — Other Ambulatory Visit (HOSPITAL_COMMUNITY): Payer: Self-pay

## 2024-01-07 ENCOUNTER — Encounter: Payer: Self-pay | Admitting: Family Medicine

## 2024-01-08 ENCOUNTER — Other Ambulatory Visit (HOSPITAL_COMMUNITY): Payer: Self-pay

## 2024-01-09 ENCOUNTER — Other Ambulatory Visit (HOSPITAL_COMMUNITY): Payer: Self-pay

## 2024-01-14 ENCOUNTER — Other Ambulatory Visit: Payer: Self-pay | Admitting: Family Medicine

## 2024-01-14 ENCOUNTER — Ambulatory Visit
Admission: RE | Admit: 2024-01-14 | Discharge: 2024-01-14 | Disposition: A | Discharge status: Home / Self Care | Source: Ambulatory Visit | Attending: Family Medicine | Admitting: Family Medicine

## 2024-01-14 DIAGNOSIS — R0981 Nasal congestion: Secondary | ICD-10-CM

## 2024-01-16 ENCOUNTER — Ambulatory Visit: Payer: 59 | Attending: Internal Medicine | Admitting: Internal Medicine

## 2024-01-16 ENCOUNTER — Encounter: Payer: Self-pay | Admitting: Internal Medicine

## 2024-01-16 ENCOUNTER — Other Ambulatory Visit (HOSPITAL_COMMUNITY): Payer: Self-pay

## 2024-01-16 ENCOUNTER — Other Ambulatory Visit: Payer: Self-pay

## 2024-01-16 VITALS — BP 122/81 | HR 90 | Resp 14 | Ht 60.0 in | Wt 151.0 lb

## 2024-01-16 DIAGNOSIS — M797 Fibromyalgia: Secondary | ICD-10-CM | POA: Diagnosis not present

## 2024-01-16 DIAGNOSIS — Z79899 Other long term (current) drug therapy: Secondary | ICD-10-CM | POA: Diagnosis not present

## 2024-01-16 DIAGNOSIS — M150 Primary generalized (osteo)arthritis: Secondary | ICD-10-CM | POA: Diagnosis not present

## 2024-01-16 DIAGNOSIS — M069 Rheumatoid arthritis, unspecified: Secondary | ICD-10-CM | POA: Diagnosis not present

## 2024-01-16 DIAGNOSIS — Z7952 Long term (current) use of systemic steroids: Secondary | ICD-10-CM | POA: Diagnosis not present

## 2024-01-16 DIAGNOSIS — L4050 Arthropathic psoriasis, unspecified: Secondary | ICD-10-CM

## 2024-01-16 MED ORDER — AMPHETAMINE-DEXTROAMPHETAMINE 30 MG PO TABS
30.0000 mg | ORAL_TABLET | Freq: Two times a day (BID) | ORAL | 0 refills | Status: DC
  Filled 2024-02-07 (×2): qty 60, 30d supply, fill #0

## 2024-01-16 MED ORDER — HYDROXYCHLOROQUINE SULFATE 200 MG PO TABS
400.0000 mg | ORAL_TABLET | Freq: Every day | ORAL | 0 refills | Status: DC
  Filled 2024-01-16: qty 180, 90d supply, fill #0

## 2024-01-16 MED ORDER — AMPHETAMINE-DEXTROAMPHETAMINE 30 MG PO TABS
30.0000 mg | ORAL_TABLET | Freq: Two times a day (BID) | ORAL | 0 refills | Status: AC
Start: 2024-01-16 — End: ?
  Filled 2024-05-30: qty 60, 30d supply, fill #0

## 2024-01-16 MED ORDER — AMPHETAMINE-DEXTROAMPHETAMINE 30 MG PO TABS
30.0000 mg | ORAL_TABLET | Freq: Two times a day (BID) | ORAL | 0 refills | Status: AC
Start: 2024-01-16 — End: ?
  Filled 2024-04-11: qty 60, 30d supply, fill #0

## 2024-01-16 MED ORDER — AMITRIPTYLINE HCL 25 MG PO TABS
25.0000 mg | ORAL_TABLET | Freq: Every day | ORAL | 2 refills | Status: DC
  Filled 2024-01-16: qty 30, 30d supply, fill #0

## 2024-01-16 NOTE — Progress Notes (Signed)
 Office Visit Note  Patient: Megan Tanner             Date of Birth: [birth year]           MRN: [id number]             PCP: Noberto Retort, MD Referring: Noberto Retort, MD Visit Date: 01/16/2024   Subjective:  Follow-up   History of Present Illness: Megan Tanner is a 58 y.o. female here for follow up for psoriatic arthritis on hydroxychloroquine for 400 mg daily presents with joint pain, particularly in the wrists, thumbs, neck, and hips. States that overall she has been doing about the same as she was doing in December. Her first second and fourth digits are hurting today and has been having difficulty gripping things. Her stiffness in the morning lasts a couple hours, and is unable to function if she does not take the ibuprofen twice a day. She tried tumeric but did not find it helpful.   She is also having leg pain, primarily radiating from her spine.   Previous HPI 10/17/2023 Megan Tanner is a 58 y.o. female here for follow up for psoriatic arthritis on hydroxychloroquine for 400 mg daily presents with worsening joint pain, particularly in the wrists, thumbs, neck, and hips. They report a history of a broken wrist, which is now experiencing similar pain, along with cracking and popping sensations. The patient also describes a radiating pain from the knee to the hip, suspected to be sciatic nerve-related. They have received a cortisone injection in the right knee a couple of months ago.   The patient also reports dry eyes and mouth. They also report difficulty sleeping due to pain, waking up every two hours. They have tried gabapentin in the past but discontinued due to dizziness. They have also tried Cymbalta for fibromyalgia but did not notice a significant difference in pain levels.   The patient also mentions a recent sinus infection, for which they completed a course of Augmentin. They have tried taking turmeric supplements but discontinued due to nausea and vomiting.  They also report worsening nail conditions, with toenails raising up and causing holes in socks.      Previous HPI 06/11/2023 Megan Tanner is a 58 y.o. female here for follow up for psoriatic arthritis on hydroxychloroquine for 400 mg daily and Tremfya 100 mg every 8 weeks since starting in February.  Currently feels symptoms are not well-controlled.  Pain is worse after activity and severely limiting her exercise.  She thought there was initial benefit in joint pain and stiffness with the start of Tremfya and definite decrease in skin rashes.  However after the first 2 doses feels the symptoms were not getting back more to at baseline. She had breakout of painful rash on her right breast similar to previous shingles at this area. Subsequently also with some rash around the eyes that was new not particularly associated by timing with the Tremfya injections.  Currently having joint pain most regularly in her fingers and the anterior portion of both feet.  Right knee pain wearing a brace for this had steroid injection with partial benefit.   Previous HPI 03/14/2023 Megan Tanner is a 58 y.o. female here for follow up for psoriatic arthritis on hydroxychloroquine for 400 mg daily and after starting Tremfya 100 mg in February now at maintenance scheduled every 8 weeks.  Within the first month after starting she saw clearance of skin rashes on her elbows  and groin area completely.  Has still had persistent joint issues had x-ray and a steroid injection in the right shoulder.  Also had a Synvisc injection in the right knee about a month ago for osteoarthritis.  Still has some persistent pain in that area specially has to take caution with climbing stairs and is wearing knee braces for support.  Also with some right ankle pain.  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   Previous  HPI 12/13/22 Megan Tanner is a 58 y.o. female here for follow up for inflammatory arthritis on HCQ 400 mg daily and ibuprofen 800 mg PRN. Symptoms remain about the same as last time. Lab testing was all negative except mildly elevated cholesterol. 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    Previous HPI 11/08/22 Megan Tanner is a 58 y.o. female here for second opinion for psoriatic arthritis. She was previously seeing Dr. Kathi Ludwig for management including plaquenil 400 mg daily. She has taken multiple previous treatments most recently Orencia with incomplete symptom improvement.  She is also using ibuprofen 800 mg as needed for breakthrough inflammatory and osteoarthritis pain.  She has tried several longer acting or prescription or topical NSAIDs all of which were not as effective as the high-dose ibuprofen. She has been having at least 5 or 6 years of persistent pain affecting her hands with worsening bony nodules with episodic redness swelling and nail changes. She has fingernail ridges with some nail discoloration and separation in the great toe.  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  So far oral and topical medications have not improved these rashes.  They are not painful or itchy. She has had a chronic injury to the right knee that occurred falling down stairs and sees Dr. Madelon Lips for long-term management has had MRIs but trying to avoid surgery.  Also has received previous viscosupplementation injection series. She did not have great benefit from these. Steroid shots have been helpful for at least a month or more but not especially long either.    DMARD Hx Methotrexate - Rashes Leflunomide - Intolerance Humira - Incomplete response Remicade - Incomplete response Xeljanz - Nonresponder Cosentyx - Nonresponder Orencia -  Nonresponder  Review of Systems  Constitutional:  Positive for fatigue.  HENT:  Positive for mouth sores and mouth dryness.   Eyes:  Positive for dryness.  Respiratory:  Negative for shortness of breath.   Cardiovascular:  Negative for chest pain and palpitations.  Gastrointestinal:  Negative for blood in stool, constipation and diarrhea.  Endocrine: Negative for increased urination.  Genitourinary:  Negative for involuntary urination.  Musculoskeletal:  Positive for joint pain, joint pain, joint swelling, myalgias, morning stiffness, muscle tenderness and myalgias. Negative for gait problem and muscle weakness.  Skin:  Positive for hair loss. Negative for color change, rash and sensitivity to sunlight.  Allergic/Immunologic: Negative for susceptible to infections.  Neurological:  Positive for dizziness and headaches.  Hematological:  Negative for swollen glands.  Psychiatric/Behavioral:  Positive for depressed mood and sleep disturbance. The patient is not nervous/anxious.     PMFS History:  Patient Active Problem List   Diagnosis Date Noted   High risk medication use 12/13/2022   Chronic fatigue 07/06/2020   Fibromyalgia  affecting multiple sites 07/06/2020   Hypertension 07/06/2020   IBS (irritable bowel syndrome) 07/06/2020   Migraines 07/06/2020   Osteoarthritis 07/06/2020   Psoriatic arthritis (HCC) 07/06/2020   Restless leg syndrome 07/06/2020    Past Medical History:  Diagnosis Date   Allergy    Arthritis    Dry eyes    Dry mouth    Fatigue    Fibromyalgia    Hypertension    IBS (irritable bowel syndrome)    Migraines    Seizures (HCC)    as  a baby from fever    Family History  Problem Relation Age of Onset   Osteoarthritis Mother    Hypertension Mother    Breast cancer Mother    Macular degeneration Mother    Heart disease Mother    COPD Mother    Glaucoma Mother    Osteoarthritis Father    Glaucoma Father    Hypertension Father    Stroke Father     Parkinson's disease Father    Dementia Father    Colon cancer Neg Hx    Colon polyps Neg Hx    Esophageal cancer Neg Hx    Rectal cancer Neg Hx    Stomach cancer Neg Hx    Past Surgical History:  Procedure Laterality Date   ABDOMINAL HYSTERECTOMY     c sections     1990 1993   COLONOSCOPY     KNEE SURGERY     KNEE SURGERY Right     has had 6 knee surgery   SHOULDER SURGERY     Social History   Social History Narrative   Not on file   Immunization History  Administered Date(s) Administered   PFIZER(Purple Top)SARS-COV-2 Vaccination 10/28/2019, 11/24/2019   Tdap 12/14/2023     Objective: Vital Signs: BP 122/81 (BP Location: Left Arm, Patient Position: Sitting, Cuff Size: Normal)   Pulse 90   Resp 14   Ht 5' (1.524 m)   Wt 151 lb (68.5 kg)   BMI 29.49 kg/m    Physical Exam Eyes:     Conjunctiva/sclera: Conjunctivae normal.  Cardiovascular:     Rate and Rhythm: Normal rate and regular rhythm.  Pulmonary:     Effort: Pulmonary effort is normal.     Breath sounds: Normal breath sounds.  Lymphadenopathy:     Cervical: No cervical adenopathy.  Skin:    General: Skin is warm and dry.     Comments: Longitudinal nail ridges, splitting distally left thumb, no pitting or lesions  Neurological:     Mental Status: She is alert.  Psychiatric:        Mood and Affect: Mood normal.      Musculoskeletal Exam:  Shoulders full ROM, some pain at shoulders and upper back with overhead abduction no focal tenderness Elbows full ROM no tenderness or swelling Wrists full ROM no tenderness or swelling Fingers full ROM, DIP heberdon's nodes with mild flexion deformities but reducible, 1st CMC joint tenderness, distal joints without focal tenderness, no palpable synovitis Knees full ROM medial joint line tenderness to pressure   Investigation: No additional findings.  Imaging: No results found.  Recent Labs: Lab Results  Component Value Date   WBC 7.1 03/14/2023   HGB  15.8 (H) 03/14/2023   PLT 312 03/14/2023   NA 143 06/11/2023   K 3.9 06/11/2023   CL 102 06/11/2023   CO2 31 06/11/2023   GLUCOSE 95 06/11/2023   BUN 19 06/11/2023   CREATININE 0.78 06/11/2023  BILITOT 0.4 03/14/2023   AST 17 03/14/2023   ALT 20 03/14/2023   PROT 7.0 03/14/2023   CALCIUM 10.1 06/11/2023   QFTBGOLDPLUS NEGATIVE 11/08/2022    Speciality Comments: PLQ Eye Exam: 04/25/2023 Normal  @[place]  Ophthalmology f/u 1 year  Procedures:  No procedures performed Allergies: Codeine, Erythromycin, Fluticasone, Lisinopril, Methotrexate, Sulfa antibiotics, Sumatriptan, Tramadol, and Tyloxapol   Assessment / Plan:     Visit Diagnoses: Psoriatic arthritis (HCC)  - Plan: Sedimentation rate Joint inflammation appears reasonably well-controlled.  Increasing nail ridging and splitting issues may be nail psoriasis activity but there is not obvious discoloration pitting thickening or other associated lesions.  Discussed possibility of restarting a biologic she feels symptoms are currently manageable prefers against this today. - Checking sed rate for disease activity monitoring - Continue HCQ 400 mg daily, will need to review dosing to less than 5 mg/kg long term but maintain for today  Primary osteoarthritis involving multiple joints Hands and wrists affected b/l, hips seem more chronic tendinopathy or bursitis related - Continue ibuprofen 800 mg up to TID PRN for OA  High risk medication use - hydroxychloroquine 400 mg daily. PLQ Eye Exam: 04/25/2023 Normal - Plan: CBC with Differential/Platelet, COMPLETE METABOLIC PANEL WITH GFR - Checking CBC CMP medication monitoring on long-term continued use of hydroxychloroquine and moderately high-dose ibuprofen  Long term (current) use of systemic steroids - prednisone 5 mg daily  Fibromyalgia affecting multiple sites - Plan: amitriptyline (ELAVIL) 25 MG tablet Intolerance and lack of efficacy with gabapentin and Cymbalta.  Has not  previously been tried on TCA.  She did notice some significant mouth dryness on medication before so we will monitor for this. -Start Elavil 25 mg p.o. nightly   Orders: Orders Placed This Encounter  Procedures   Sedimentation rate   CBC with Differential/Platelet   COMPLETE METABOLIC PANEL WITH GFR   Meds ordered this encounter  Medications   amitriptyline (ELAVIL) 25 MG tablet    Sig: Take 1 tablet (25 mg total) by mouth at bedtime.    Dispense:  30 tablet    Refill:  2   hydroxychloroquine (PLAQUENIL) 200 MG tablet    Sig: Take 2 tablets (400 mg total) by mouth daily with food or milk.    Dispense:  180 tablet    Refill:  0     Follow-Up Instructions: Return in about 3 months (around 04/17/2024) for PsA/OA on HCQ/NSAID/TCA start f/u 3mos.   Fuller Plan, MD  Note - This record has been created using [employer].  Chart creation errors have been sought, but may not always  have been located. Such creation errors do not reflect on  the standard of medical care.

## 2024-01-17 ENCOUNTER — Other Ambulatory Visit (HOSPITAL_COMMUNITY): Payer: Self-pay

## 2024-01-17 LAB — COMPLETE METABOLIC PANEL WITH GFR
AG Ratio: 2.3 (calc) (ref 1.0–2.5)
ALT: 16 U/L (ref 6–29)
AST: 15 U/L (ref 10–35)
Albumin: 4.4 g/dL (ref 3.6–5.1)
Alkaline phosphatase (APISO): 49 U/L (ref 37–153)
BUN: 18 mg/dL (ref 7–25)
CO2: 33 mmol/L — ABNORMAL HIGH (ref 20–32)
Calcium: 9.8 mg/dL (ref 8.6–10.4)
Chloride: 102 mmol/L (ref 98–110)
Creat: 1 mg/dL (ref 0.50–1.03)
Globulin: 1.9 g/dL (ref 1.9–3.7)
Glucose, Bld: 100 mg/dL — ABNORMAL HIGH (ref 65–99)
Potassium: 4.1 mmol/L (ref 3.5–5.3)
Sodium: 142 mmol/L (ref 135–146)
Total Bilirubin: 0.4 mg/dL (ref 0.2–1.2)
Total Protein: 6.3 g/dL (ref 6.1–8.1)

## 2024-01-17 LAB — CBC WITH DIFFERENTIAL/PLATELET
Absolute Lymphocytes: 1548 {cells}/uL (ref 850–3900)
Absolute Monocytes: 551 {cells}/uL (ref 200–950)
Basophils Absolute: 69 {cells}/uL (ref 0–200)
Basophils Relative: 1.3 %
Eosinophils Absolute: 249 {cells}/uL (ref 15–500)
Eosinophils Relative: 4.7 %
HCT: 45.3 % — ABNORMAL HIGH (ref 35.0–45.0)
Hemoglobin: 15.1 g/dL (ref 11.7–15.5)
MCH: 30.5 pg (ref 27.0–33.0)
MCHC: 33.3 g/dL (ref 32.0–36.0)
MCV: 91.5 fL (ref 80.0–100.0)
MPV: 10.1 fL (ref 7.5–12.5)
Monocytes Relative: 10.4 %
Neutro Abs: 2883 {cells}/uL (ref 1500–7800)
Neutrophils Relative %: 54.4 %
Platelets: 277 10*3/uL (ref 140–400)
RBC: 4.95 10*6/uL (ref 3.80–5.10)
RDW: 12 % (ref 11.0–15.0)
Total Lymphocyte: 29.2 %
WBC: 5.3 10*3/uL (ref 3.8–10.8)

## 2024-01-17 LAB — SEDIMENTATION RATE: Sed Rate: 6 mm/h (ref 0–30)

## 2024-01-17 NOTE — Progress Notes (Signed)
 Lab results look great her sed rate and blood count and metabolic panel are all normal.

## 2024-01-18 ENCOUNTER — Other Ambulatory Visit (HOSPITAL_COMMUNITY): Payer: Self-pay

## 2024-01-30 ENCOUNTER — Other Ambulatory Visit: Payer: Self-pay

## 2024-01-30 MED ORDER — AZITHROMYCIN 250 MG PO TABS
ORAL_TABLET | ORAL | 0 refills | Status: AC
  Filled 2024-02-01 (×2): qty 6, 5d supply, fill #0

## 2024-02-01 ENCOUNTER — Other Ambulatory Visit: Payer: Self-pay

## 2024-02-01 ENCOUNTER — Other Ambulatory Visit (HOSPITAL_COMMUNITY): Payer: Self-pay

## 2024-02-04 ENCOUNTER — Other Ambulatory Visit (HOSPITAL_COMMUNITY): Payer: Self-pay

## 2024-02-07 ENCOUNTER — Other Ambulatory Visit (HOSPITAL_COMMUNITY): Payer: Self-pay

## 2024-02-07 ENCOUNTER — Encounter (HOSPITAL_COMMUNITY): Payer: Self-pay

## 2024-02-07 ENCOUNTER — Other Ambulatory Visit: Payer: Self-pay

## 2024-02-07 MED ORDER — LOSARTAN POTASSIUM 100 MG PO TABS
100.0000 mg | ORAL_TABLET | Freq: Every day | ORAL | 1 refills | Status: DC
Start: 2024-02-06 — End: 2024-07-30
  Filled 2024-02-07: qty 90, 90d supply, fill #0
  Filled 2024-05-06: qty 90, 90d supply, fill #1

## 2024-02-07 MED ORDER — IBUPROFEN 800 MG PO TABS
800.0000 mg | ORAL_TABLET | Freq: Three times a day (TID) | ORAL | 1 refills | Status: DC
  Filled 2024-02-07: qty 270, 90d supply, fill #0
  Filled 2024-05-06: qty 270, 90d supply, fill #1

## 2024-02-07 MED ORDER — ALBUTEROL SULFATE HFA 108 (90 BASE) MCG/ACT IN AERS
2.0000 | INHALATION_SPRAY | RESPIRATORY_TRACT | 0 refills | Status: AC | PRN
Start: 2024-02-06 — End: ?
  Filled 2024-02-07: qty 6.7, 17d supply, fill #0

## 2024-02-08 ENCOUNTER — Other Ambulatory Visit: Payer: Self-pay

## 2024-02-18 ENCOUNTER — Other Ambulatory Visit: Payer: Self-pay | Admitting: Obstetrics and Gynecology

## 2024-02-18 DIAGNOSIS — Z1231 Encounter for screening mammogram for malignant neoplasm of breast: Secondary | ICD-10-CM

## 2024-02-19 DIAGNOSIS — M1711 Unilateral primary osteoarthritis, right knee: Secondary | ICD-10-CM | POA: Diagnosis not present

## 2024-03-03 ENCOUNTER — Other Ambulatory Visit: Payer: Self-pay

## 2024-03-21 ENCOUNTER — Ambulatory Visit
Admission: RE | Admit: 2024-03-21 | Discharge: 2024-03-21 | Disposition: A | Discharge status: Home / Self Care | Source: Ambulatory Visit | Attending: Obstetrics and Gynecology | Admitting: Obstetrics and Gynecology

## 2024-03-21 DIAGNOSIS — Z1231 Encounter for screening mammogram for malignant neoplasm of breast: Secondary | ICD-10-CM

## 2024-03-27 ENCOUNTER — Other Ambulatory Visit: Payer: Self-pay

## 2024-03-27 ENCOUNTER — Other Ambulatory Visit (HOSPITAL_COMMUNITY): Payer: Self-pay

## 2024-04-02 NOTE — Progress Notes (Signed)
 Office Visit Note  Patient: Megan Tanner             Date of Birth: Jun 09, 1966           MRN: [id number]             PCP: Arloa Elsie SAUNDERS, MD Referring: Arloa Elsie SAUNDERS, MD Visit Date: 04/16/2024   Subjective:  Follow-up (Patient states she has a plaquenil  eye exam scheduled for today. )   Discussed the use of AI scribe software for clinical note transcription with the patient, who gave verbal consent to proceed.  History of Present Illness   here for follow up for psoriatic arthritis on hydroxychloroquine  for 400 mg daily. She presents with worsening joint pain and skin symptoms.  She experiences persistent joint pain, particularly in her knees, hands, and wrists. A cortisone injection in her knee followed by a synvisc(?) injection initially caused swelling but improved the sensation of 'bone on bone'. However, significant discomfort persists, with pain radiating to her hip and sciatic nerve. Her knees and feet swell, especially at the end of the day, causing difficulty walking when her feet swell.  Her skin symptoms have worsened, including a cyclic rash, red patches, and bumps under her eyes that resolve spontaneously. Her fingernails and toenails have developed ridges and are causing holes in her socks. She notes hair loss and very dry skin, including her eyes.  She previously used Tremfya , which she believes helped with her symptoms, but has since discontinued it. Amitriptyline  was tried but caused significant sedation without noticeable improvement in symptoms.  She has been on hydroxychloroquine  for approximately 15 years, taking 400 mg daily. Attempts to discontinue it in the past resulted in increased pain. She has not experienced any eye symptoms so far.  Her psoriasis is not as bad as it has been completely off treatment, but her nails and skin symptoms have worsened since stopping Tremfya . She experiences itching and discomfort in her skin and nails, and her eyes have  been particularly bothersome.       Previous HPI 01/16/2024 Megan Tanner is a 58 y.o. female here for follow up for psoriatic arthritis on hydroxychloroquine  for 400 mg daily presents with joint pain, particularly in the wrists, thumbs, neck, and hips. States that overall she has been doing about the same as she was doing in December. Her first second and fourth digits are hurting today and has been having difficulty gripping things. Her stiffness in the morning lasts a couple hours, and is unable to function if she does not take the ibuprofen  twice a day. She tried tumeric but did not find it helpful.    She is also having leg pain, primarily radiating from her spine.    Previous HPI 10/17/2023 Megan Tanner is a 58 y.o. female here for follow up for psoriatic arthritis on hydroxychloroquine  for 400 mg daily presents with worsening joint pain, particularly in the wrists, thumbs, neck, and hips. They report a history of a broken wrist, which is now experiencing similar pain, along with cracking and popping sensations. The patient also describes a radiating pain from the knee to the hip, suspected to be sciatic nerve-related. They have received a cortisone injection in the right knee a couple of months ago.   The patient also reports dry eyes and mouth. They also report difficulty sleeping due to pain, waking up every two hours. They have tried gabapentin in the past but discontinued due to dizziness. They have also  tried Cymbalta  for fibromyalgia but did not notice a significant difference in pain levels.   The patient also mentions a recent sinus infection, for which they completed a course of Augmentin . They have tried taking turmeric supplements but discontinued due to nausea and vomiting. They also report worsening nail conditions, with toenails raising up and causing holes in socks.      Previous HPI 06/11/2023 Megan Tanner is a 58 y.o. female here for follow up for psoriatic arthritis  on hydroxychloroquine  for 400 mg daily and Tremfya  100 mg every 8 weeks since starting in February.  Currently feels symptoms are not well-controlled.  Pain is worse after activity and severely limiting her exercise.  She thought there was initial benefit in joint pain and stiffness with the start of Tremfya  and definite decrease in skin rashes.  However after the first 2 doses feels the symptoms were not getting back more to at baseline. She had breakout of painful rash on her right breast similar to previous shingles at this area. Subsequently also with some rash around the eyes that was new not particularly associated by timing with the Tremfya  injections.  Currently having joint pain most regularly in her fingers and the anterior portion of both feet.  Right knee pain wearing a brace for this had steroid injection with partial benefit.   Previous HPI 03/14/2023 Megan Tanner is a 58 y.o. female here for follow up for psoriatic arthritis on hydroxychloroquine  for 400 mg daily and after starting Tremfya  100 mg in February now at maintenance scheduled every 8 weeks.  Within the first month after starting she saw clearance of skin rashes on her elbows and groin area completely.  Has still had persistent joint issues had x-ray and a steroid injection in the right shoulder.  Also had a Synvisc injection in the right knee about a month ago for osteoarthritis.  Still has some persistent pain in that area specially has to take caution with climbing stairs and is wearing knee braces for support.  Also with some right ankle pain.  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   Previous HPI 12/13/22 Megan Tanner is a 58 y.o. female here for follow up for inflammatory arthritis on HCQ 400 mg daily and ibuprofen  800 mg PRN. Symptoms remain about the same as last time. Lab testing was all negative except  mildly elevated cholesterol. 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    Previous HPI 11/08/22 Megan Tanner is a 58 y.o. female here for second opinion for psoriatic arthritis. She was previously seeing Dr. Leni for management including plaquenil  400 mg daily. She has taken multiple previous treatments most recently Orencia  with incomplete symptom improvement.  She is also using ibuprofen  800 mg as needed for breakthrough inflammatory and osteoarthritis pain.  She has tried several longer acting or prescription or topical NSAIDs all of which were not as effective as the high-dose ibuprofen . She has been having at least 5 or 6 years of persistent pain affecting her hands with worsening bony nodules with episodic redness swelling and nail changes. She has fingernail ridges with some nail discoloration and separation in the great toe.  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  So far  oral and topical medications have not improved these rashes.  They are not painful or itchy. She has had a chronic injury to the right knee that occurred falling down stairs and sees Dr. Shari for long-term management has had MRIs but trying to avoid surgery.  Also has received previous viscosupplementation injection series. She did not have great benefit from these. Steroid shots have been helpful for at least a month or more but not especially long either.    DMARD Hx Methotrexate - Rashes Leflunomide - Intolerance Humira  - Incomplete response Remicade - Incomplete response Xeljanz - Nonresponder Cosentyx  - Nonresponder Orencia  - Nonresponder   Review of Systems  Constitutional:  Positive for fatigue.  HENT:  Positive for mouth sores and mouth dryness.   Eyes:  Positive for dryness.  Respiratory:  Negative for shortness of breath.   Cardiovascular:   Negative for chest pain and palpitations.  Gastrointestinal:  Positive for constipation and diarrhea. Negative for blood in stool.  Endocrine: Negative for increased urination.  Genitourinary:  Negative for involuntary urination.  Musculoskeletal:  Positive for joint pain, joint pain, joint swelling, myalgias, muscle weakness, morning stiffness, muscle tenderness and myalgias. Negative for gait problem.  Skin:  Positive for color change, rash, hair loss and sensitivity to sunlight.  Allergic/Immunologic: Negative for susceptible to infections.  Neurological:  Positive for dizziness and headaches.  Hematological:  Negative for swollen glands.  Psychiatric/Behavioral:  Positive for depressed mood and sleep disturbance. The patient is not nervous/anxious.     PMFS History:  Patient Active Problem List   Diagnosis Date Noted   High risk medication use 12/13/2022   Chronic fatigue 07/06/2020   Fibromyalgia affecting multiple sites 07/06/2020   Hypertension 07/06/2020   IBS (irritable bowel syndrome) 07/06/2020   Migraines 07/06/2020   Osteoarthritis 07/06/2020   Psoriatic arthritis (HCC) 07/06/2020   Restless leg syndrome 07/06/2020    Past Medical History:  Diagnosis Date   Allergy    Arthritis    Dry eyes    Dry mouth    Fatigue    Fibromyalgia    Hypertension    IBS (irritable bowel syndrome)    Migraines    Seizures (HCC)    as  a baby from fever    Family History  Problem Relation Age of Onset   Osteoarthritis Mother    Hypertension Mother    Breast cancer Mother    Macular degeneration Mother    Heart disease Mother    COPD Mother    Glaucoma Mother    Osteoarthritis Father    Glaucoma Father    Hypertension Father    Stroke Father    Parkinson's disease Father    Dementia Father    Colon cancer Neg Hx    Colon polyps Neg Hx    Esophageal cancer Neg Hx    Rectal cancer Neg Hx    Stomach cancer Neg Hx    Past Surgical History:  Procedure Laterality Date    ABDOMINAL HYSTERECTOMY     c sections     1990 1993   COLONOSCOPY     KNEE SURGERY     KNEE SURGERY Right     has had 6 knee surgery   SHOULDER SURGERY     Social History   Social History Narrative   Not on file   Immunization History  Administered Date(s) Administered   PFIZER(Purple Top)SARS-COV-2 Vaccination 10/28/2019, 11/24/2019   Tdap 12/14/2023     Objective: Vital Signs: BP 121/77 (BP Location:  Left Arm, Patient Position: Sitting, Cuff Size: Normal)   Pulse 94   Resp 14   Ht 5' (1.524 m)   Wt 154 lb (69.9 kg)   BMI 30.08 kg/m    Physical Exam  Eyes:     Conjunctiva/sclera: Conjunctivae normal.    Cardiovascular:     Rate and Rhythm: Normal rate and regular rhythm.  Pulmonary:     Effort: Pulmonary effort is normal.     Breath sounds: Normal breath sounds.   Musculoskeletal:     Right lower leg: No edema.     Left lower leg: No edema.   Skin:    General: Skin is warm and dry.     Comments: Dystrophic nail changes with longitudinal ridge and discoloration worst on right foot   Neurological:     Mental Status: She is alert.   Psychiatric:        Mood and Affect: Mood normal.      Musculoskeletal Exam:  Shoulders full ROM no tenderness or swelling Elbows full ROM no tenderness or swelling Wrists full ROM no tenderness or swelling Fingers full ROM, tenderness to pressure on left hand MCPs, right 2nd-3rd DIPs, heberdon's nodes, no palpable synovitis Low back b/l paraspinal muscle tenderness to pressure Hip normal internal and external rotation, lateral pain with rotation and tenderness to pressure Knees full ROM, patellofemoral crepitus, right knee brace Ankles full ROM no tenderness or swelling MTPs full ROM, tenderness to squeeze and with flexion/extension in 1st-2nd toes   Investigation: No additional findings.  Imaging: MM 3D SCREENING MAMMOGRAM BILATERAL BREAST Result Date: 03/26/2024 CLINICAL DATA:  Screening. EXAM: DIGITAL  SCREENING BILATERAL MAMMOGRAM WITH TOMOSYNTHESIS AND CAD TECHNIQUE: Bilateral screening digital craniocaudal and mediolateral oblique mammograms were obtained. Bilateral screening digital breast tomosynthesis was performed. The images were evaluated with computer-aided detection. COMPARISON:  Previous exam(s). ACR Breast Density Category b: There are scattered areas of fibroglandular density. FINDINGS: There are no findings suspicious for malignancy. IMPRESSION: No mammographic evidence of malignancy. A result letter of this screening mammogram will be mailed directly to the patient. RECOMMENDATION: Screening mammogram in one year. (Code:SM-B-01Y) BI-RADS CATEGORY  1: Negative. Electronically Signed   By: Craig Farr M.D.   On: 03/26/2024 14:46    Recent Labs: Lab Results  Component Value Date   WBC 5.3 01/16/2024   HGB 15.1 01/16/2024   PLT 277 01/16/2024   NA 142 01/16/2024   K 4.1 01/16/2024   CL 102 01/16/2024   CO2 33 (H) 01/16/2024   GLUCOSE 100 (H) 01/16/2024   BUN 18 01/16/2024   CREATININE 1.00 01/16/2024   BILITOT 0.4 01/16/2024   AST 15 01/16/2024   ALT 16 01/16/2024   PROT 6.3 01/16/2024   CALCIUM 9.8 01/16/2024   QFTBGOLDPLUS NEGATIVE 11/08/2022    Speciality Comments: PLQ Eye Exam: 04/25/2023 Normal  @[facility]  Ophthalmology f/u 1 year  Per patient, eye exam scheduled for 04/16/2024  Procedures:  No procedures performed Allergies: Codeine, Erythromycin, Fluticasone, Lisinopril, Methotrexate, Sulfa antibiotics, Sumatriptan, Tramadol, and Tyloxapol   Assessment / Plan:     Visit Diagnoses: Psoriatic arthritis (HCC)  Rheumatoid arthritis - Plan: Sedimentation rate, C-reactive protein Worsening symptoms after discontinuation of Tremfya , with increased pain and stiffness in hands, wrists, and knees. No significant joint swelling. Symptoms previously improved with Tremfya . -Recommend a small dose adjustment hydroxychloroquine  down to 300 mg daily to stay under max  recommended long-term dose with a body mass of 69.9 kg - Restart Tremfya  100 mg [place] every eight  weeks. - Recheck laboratory numbers (sed rate CRP) to monitor inflammation and systemic control.  High risk medication use - hydroxychloroquine  400 mg daily. PLQ Eye Exam: 04/25/2023 Normal - Plan: QuantiFERON-TB Gold Plus - Rechecking annual QuantiFERON screening since resuming biologic DMARD with Tremfya   Primary osteoarthritis involving multiple joints - ibuprofen  800 mg up to TID PRN for OA  Fibromyalgia affecting multiple sites Elavil  was not effective mostly just gave her side effects so discontinued this quickly  Psoriasis Worsening of nail symptoms and skin itching after discontinuation of Tremfya . Increased ridging and deterioration of fingernails and toenails, and hair loss, previously controlled with Tremfya . - Restart Tremfya  as above  Overdose of hydroxychloroquine  Current hydroxychloroquine  dose of 400 mg exceeds recommended maximum of 5 mg/kg for her weight of just under 70 kg, increasing risk of ocular side effects. -Recent eye exam this month was fine - Adjust hydroxychloroquine  dose to 200 mg daily, alternating with 300 mg every other day, or cut tablets to achieve a daily dose of 300 mg. - Write prescription refill to reflect adjusted dosing.        Orders: Orders Placed This Encounter  Procedures   Sedimentation rate   C-reactive protein   QuantiFERON-TB Gold Plus   Meds ordered this encounter  Medications   hydroxychloroquine  (PLAQUENIL ) 200 MG tablet    Sig: Take 1.5 tablets (300 mg total) by mouth daily.    Dispense:  135 tablet    Refill:  1     Follow-Up Instructions: Return in about 3 months (around 07/17/2024) for PsA on HCQ/GUS restart f/u 3mos.   Lonni LELON Ester, MD  Note - This record has been created using [employer].  Chart creation errors have been sought, but may not always  have been located. Such creation errors do not reflect on   the standard of medical care.

## 2024-04-11 ENCOUNTER — Other Ambulatory Visit: Payer: Self-pay

## 2024-04-11 ENCOUNTER — Other Ambulatory Visit (HOSPITAL_COMMUNITY): Payer: Self-pay

## 2024-04-11 MED ORDER — SERTRALINE HCL 100 MG PO TABS
200.0000 mg | ORAL_TABLET | Freq: Every day | ORAL | 1 refills | Status: DC
  Filled 2024-04-11 (×2): qty 180, 90d supply, fill #0
  Filled 2024-07-14: qty 180, 90d supply, fill #1

## 2024-04-16 ENCOUNTER — Encounter: Payer: Self-pay | Admitting: Internal Medicine

## 2024-04-16 ENCOUNTER — Ambulatory Visit: Attending: Internal Medicine | Admitting: Internal Medicine

## 2024-04-16 ENCOUNTER — Other Ambulatory Visit (HOSPITAL_COMMUNITY): Payer: Self-pay

## 2024-04-16 ENCOUNTER — Other Ambulatory Visit: Payer: Self-pay

## 2024-04-16 VITALS — BP 121/77 | HR 94 | Resp 14 | Ht 60.0 in | Wt 154.0 lb

## 2024-04-16 DIAGNOSIS — M797 Fibromyalgia: Secondary | ICD-10-CM | POA: Diagnosis not present

## 2024-04-16 DIAGNOSIS — H52203 Unspecified astigmatism, bilateral: Secondary | ICD-10-CM | POA: Diagnosis not present

## 2024-04-16 DIAGNOSIS — M150 Primary generalized (osteo)arthritis: Secondary | ICD-10-CM | POA: Diagnosis not present

## 2024-04-16 DIAGNOSIS — L4050 Arthropathic psoriasis, unspecified: Secondary | ICD-10-CM

## 2024-04-16 DIAGNOSIS — M069 Rheumatoid arthritis, unspecified: Secondary | ICD-10-CM

## 2024-04-16 DIAGNOSIS — Z7952 Long term (current) use of systemic steroids: Secondary | ICD-10-CM | POA: Diagnosis not present

## 2024-04-16 DIAGNOSIS — Z79899 Other long term (current) drug therapy: Secondary | ICD-10-CM

## 2024-04-16 DIAGNOSIS — H5203 Hypermetropia, bilateral: Secondary | ICD-10-CM | POA: Diagnosis not present

## 2024-04-16 MED ORDER — HYDROXYCHLOROQUINE SULFATE 200 MG PO TABS
300.0000 mg | ORAL_TABLET | Freq: Every day | ORAL | 1 refills | Status: DC
  Filled 2024-04-16: qty 135, 90d supply, fill #0
  Filled 2024-07-10: qty 135, 90d supply, fill #1

## 2024-04-19 ENCOUNTER — Other Ambulatory Visit (HOSPITAL_COMMUNITY): Payer: Self-pay

## 2024-04-20 LAB — QUANTIFERON-TB GOLD PLUS
Mitogen-NIL: >10 [IU]/mL
NIL: 0.02 [IU]/mL
QuantiFERON-TB Gold Plus: NEGATIVE
TB1-NIL: 0.01 [IU]/mL
TB2-NIL: 0.02 [IU]/mL

## 2024-04-20 LAB — C-REACTIVE PROTEIN: CRP: <3 mg/L (ref <8.0)

## 2024-04-20 LAB — SEDIMENTATION RATE: Sed Rate: 2 mm/h (ref 0–30)

## 2024-04-25 ENCOUNTER — Other Ambulatory Visit (HOSPITAL_COMMUNITY): Payer: Self-pay

## 2024-04-30 DIAGNOSIS — Z6829 Body mass index (BMI) 29.0-29.9, adult: Secondary | ICD-10-CM | POA: Diagnosis not present

## 2024-04-30 DIAGNOSIS — Z01419 Encounter for gynecological examination (general) (routine) without abnormal findings: Secondary | ICD-10-CM | POA: Diagnosis not present

## 2024-05-01 ENCOUNTER — Other Ambulatory Visit: Payer: Self-pay | Admitting: Obstetrics and Gynecology

## 2024-05-01 DIAGNOSIS — Z8249 Family history of ischemic heart disease and other diseases of the circulatory system: Secondary | ICD-10-CM

## 2024-05-07 ENCOUNTER — Other Ambulatory Visit: Payer: Self-pay

## 2024-05-07 ENCOUNTER — Encounter: Payer: Self-pay | Admitting: Internal Medicine

## 2024-05-07 ENCOUNTER — Telehealth: Payer: Self-pay | Admitting: Pharmacist

## 2024-05-07 ENCOUNTER — Other Ambulatory Visit (HOSPITAL_COMMUNITY): Payer: Self-pay

## 2024-05-07 DIAGNOSIS — L4050 Arthropathic psoriasis, unspecified: Secondary | ICD-10-CM

## 2024-05-07 DIAGNOSIS — Z79899 Other long term (current) drug therapy: Secondary | ICD-10-CM

## 2024-05-07 MED ORDER — TREMFYA ONE-PRESS 100 MG/ML ~~LOC~~ SOAJ
100.0000 mg | SUBCUTANEOUS | 0 refills | Status: DC
  Filled 2024-05-12: qty 1, 56d supply, fill #0

## 2024-05-07 NOTE — Telephone Encounter (Signed)
 Patient restarting Tremfya  for PsA. Rx processes successfully through [employer] with $1000 copay  Submitted a Prior Authorization request to [facility] for TREMFYA  via CoverMyMeds. Will update once we receive a response.  Key: [id number]  Per automated respones: Member has an active PA on file which is expiring on 06/10/2024.   Rx sent to [facility]. Patient will need to be re-onboarded. Patient sent message via MyChart  Sherry Pennant, PharmD, MPH, BCPS, CPP Clinical Pharmacist (Rheumatology and Pulmonology)

## 2024-05-08 ENCOUNTER — Other Ambulatory Visit (HOSPITAL_COMMUNITY): Payer: Self-pay

## 2024-05-12 ENCOUNTER — Other Ambulatory Visit: Payer: Self-pay

## 2024-05-12 ENCOUNTER — Other Ambulatory Visit: Payer: Self-pay | Admitting: Pharmacy Technician

## 2024-05-12 ENCOUNTER — Other Ambulatory Visit (HOSPITAL_COMMUNITY): Payer: Self-pay

## 2024-05-12 NOTE — Progress Notes (Signed)
 Specialty Pharmacy Initial Fill Coordination Note  Megan Tanner is a 58 y.o. female contacted today regarding initial fill of specialty medication(s) Guselkumab  (Tremfya  One-Press)   Patient requested Delivery   Delivery date: 05/14/24   Verified address: [address]   [name] [address]   Medication will be filled on 05/13/24.   Patient is aware of $0.00 copayment.

## 2024-05-16 ENCOUNTER — Encounter: Payer: Self-pay | Admitting: Internal Medicine

## 2024-05-22 ENCOUNTER — Other Ambulatory Visit (HOSPITAL_COMMUNITY): Payer: Self-pay

## 2024-05-22 DIAGNOSIS — K219 Gastro-esophageal reflux disease without esophagitis: Secondary | ICD-10-CM | POA: Diagnosis not present

## 2024-05-22 DIAGNOSIS — K047 Periapical abscess without sinus: Secondary | ICD-10-CM | POA: Diagnosis not present

## 2024-05-22 MED ORDER — CLINDAMYCIN HCL 300 MG PO CAPS
300.0000 mg | ORAL_CAPSULE | Freq: Three times a day (TID) | ORAL | 0 refills | Status: DC
  Filled 2024-05-22: qty 30, 10d supply, fill #0

## 2024-05-29 ENCOUNTER — Other Ambulatory Visit: Payer: Self-pay

## 2024-05-30 ENCOUNTER — Other Ambulatory Visit (HOSPITAL_COMMUNITY): Payer: Self-pay

## 2024-05-30 ENCOUNTER — Other Ambulatory Visit: Payer: Self-pay

## 2024-05-30 MED ORDER — AMPHETAMINE-DEXTROAMPHETAMINE 30 MG PO TABS
30.0000 mg | ORAL_TABLET | Freq: Two times a day (BID) | ORAL | 0 refills | Status: AC
  Filled 2024-11-03: qty 60, 30d supply, fill #0

## 2024-05-30 MED ORDER — AMPHETAMINE-DEXTROAMPHETAMINE 30 MG PO TABS
30.0000 mg | ORAL_TABLET | Freq: Two times a day (BID) | ORAL | 0 refills | Status: AC
  Filled 2024-09-08: qty 60, 30d supply, fill #0

## 2024-05-30 MED ORDER — AMPHETAMINE-DEXTROAMPHETAMINE 30 MG PO TABS
30.0000 mg | ORAL_TABLET | Freq: Two times a day (BID) | ORAL | 0 refills | Status: AC
  Filled 2024-07-16: qty 60, 30d supply, fill #0

## 2024-06-11 DIAGNOSIS — D485 Neoplasm of uncertain behavior of skin: Secondary | ICD-10-CM | POA: Diagnosis not present

## 2024-06-11 DIAGNOSIS — Z85828 Personal history of other malignant neoplasm of skin: Secondary | ICD-10-CM | POA: Diagnosis not present

## 2024-06-11 DIAGNOSIS — L570 Actinic keratosis: Secondary | ICD-10-CM | POA: Diagnosis not present

## 2024-06-18 ENCOUNTER — Other Ambulatory Visit: Payer: Self-pay

## 2024-06-18 ENCOUNTER — Other Ambulatory Visit (HOSPITAL_COMMUNITY): Payer: Self-pay

## 2024-06-18 MED ORDER — AZITHROMYCIN 250 MG PO TABS
ORAL_TABLET | ORAL | 0 refills | Status: DC
  Filled 2024-06-18: qty 6, 5d supply, fill #0

## 2024-06-23 ENCOUNTER — Other Ambulatory Visit: Payer: Self-pay

## 2024-06-23 ENCOUNTER — Other Ambulatory Visit (HOSPITAL_COMMUNITY): Payer: Self-pay

## 2024-07-01 ENCOUNTER — Other Ambulatory Visit: Payer: Self-pay

## 2024-07-01 ENCOUNTER — Encounter (INDEPENDENT_AMBULATORY_CARE_PROVIDER_SITE_OTHER): Payer: Self-pay

## 2024-07-01 ENCOUNTER — Other Ambulatory Visit: Payer: Self-pay | Admitting: Internal Medicine

## 2024-07-01 DIAGNOSIS — L4050 Arthropathic psoriasis, unspecified: Secondary | ICD-10-CM

## 2024-07-01 DIAGNOSIS — Z79899 Other long term (current) drug therapy: Secondary | ICD-10-CM

## 2024-07-01 DIAGNOSIS — M170 Bilateral primary osteoarthritis of knee: Secondary | ICD-10-CM | POA: Diagnosis not present

## 2024-07-02 ENCOUNTER — Other Ambulatory Visit: Payer: Self-pay | Admitting: Pharmacy Technician

## 2024-07-02 ENCOUNTER — Other Ambulatory Visit: Payer: Self-pay

## 2024-07-02 ENCOUNTER — Encounter: Payer: Self-pay | Admitting: Internal Medicine

## 2024-07-02 DIAGNOSIS — Z79899 Other long term (current) drug therapy: Secondary | ICD-10-CM

## 2024-07-02 NOTE — Progress Notes (Signed)
 Specialty Pharmacy Refill Coordination Note  Megan Tanner is a 59 y.o. female contacted today regarding refills of specialty medication(s) Guselkumab  (Tremfya  One-Press)   Patient requested (Patient-Rptd) Delivery   Delivery date: 07/09/24 Verified address: (Patient-Rptd) [address] , [place] [address]   Medication will be filled on 07/08/24 or when Md approve.  Refills Denied; Patient needs Lab prior to refill. Megan Tanner. States she spoke with patient and aware.

## 2024-07-02 NOTE — Progress Notes (Signed)
 9/2-9/3: Spoke with patient to inform that Tremfya  was not refilled. Per Chart: MD wants patient to call office and have lab done prior to refill. Patient is aware and will call MD office.

## 2024-07-04 DIAGNOSIS — Z79899 Other long term (current) drug therapy: Secondary | ICD-10-CM | POA: Diagnosis not present

## 2024-07-04 LAB — CBC WITH DIFFERENTIAL/PLATELET
Absolute Lymphocytes: 2583 {cells}/uL (ref 850–3900)
Absolute Monocytes: 636 {cells}/uL (ref 200–950)
Basophils Absolute: 81 {cells}/uL (ref 0–200)
Basophils Relative: 1.1 %
Eosinophils Absolute: 133 {cells}/uL (ref 15–500)
Eosinophils Relative: 1.8 %
HCT: 43.9 % (ref 35.0–45.0)
Hemoglobin: 14.7 g/dL (ref 11.7–15.5)
MCH: 31.5 pg (ref 27.0–33.0)
MCHC: 33.5 g/dL (ref 32.0–36.0)
MCV: 94.2 fL (ref 80.0–100.0)
MPV: 10 fL (ref 7.5–12.5)
Monocytes Relative: 8.6 %
Neutro Abs: 3966 {cells}/uL (ref 1500–7800)
Neutrophils Relative %: 53.6 %
Platelets: 299 Thousand/uL (ref 140–400)
RBC: 4.66 Million/uL (ref 3.80–5.10)
RDW: 12 % (ref 11.0–15.0)
Total Lymphocyte: 34.9 %
WBC: 7.4 Thousand/uL (ref 3.8–10.8)

## 2024-07-04 LAB — COMPREHENSIVE METABOLIC PANEL WITH GFR
AG Ratio: 2.1 (calc) (ref 1.0–2.5)
ALT: 15 U/L (ref 6–29)
AST: 15 U/L (ref 10–35)
Albumin: 4.4 g/dL (ref 3.6–5.1)
Alkaline phosphatase (APISO): 48 U/L (ref 37–153)
BUN: 22 mg/dL (ref 7–25)
CO2: 27 mmol/L (ref 20–32)
Calcium: 9.2 mg/dL (ref 8.6–10.4)
Chloride: 100 mmol/L (ref 98–110)
Creat: 0.8 mg/dL (ref 0.50–1.03)
Globulin: 2.1 g/dL (ref 1.9–3.7)
Glucose, Bld: 100 mg/dL (ref 65–139)
Potassium: 3.4 mmol/L — ABNORMAL LOW (ref 3.5–5.3)
Sodium: 140 mmol/L (ref 135–146)
Total Bilirubin: 0.4 mg/dL (ref 0.2–1.2)
Total Protein: 6.5 g/dL (ref 6.1–8.1)
eGFR: 85 mL/min/1.73m2 (ref >=60)

## 2024-07-07 ENCOUNTER — Other Ambulatory Visit: Payer: Self-pay

## 2024-07-10 ENCOUNTER — Other Ambulatory Visit (HOSPITAL_COMMUNITY): Payer: Self-pay

## 2024-07-14 ENCOUNTER — Other Ambulatory Visit: Payer: Self-pay | Admitting: Internal Medicine

## 2024-07-14 ENCOUNTER — Other Ambulatory Visit: Payer: Self-pay

## 2024-07-14 ENCOUNTER — Other Ambulatory Visit (HOSPITAL_COMMUNITY): Payer: Self-pay

## 2024-07-14 ENCOUNTER — Telehealth: Payer: Self-pay | Admitting: Pharmacist

## 2024-07-14 DIAGNOSIS — L4050 Arthropathic psoriasis, unspecified: Secondary | ICD-10-CM

## 2024-07-14 DIAGNOSIS — Z79899 Other long term (current) drug therapy: Secondary | ICD-10-CM

## 2024-07-14 MED ORDER — TREMFYA ONE-PRESS 100 MG/ML ~~LOC~~ SOAJ
100.0000 mg | SUBCUTANEOUS | 1 refills | Status: DC
  Filled 2024-07-15: qty 1, 56d supply, fill #0

## 2024-07-14 NOTE — Telephone Encounter (Signed)
 Patient due for Tremfya . Authorization through insurance expired in August 2025 and we do not have clinical notes indicating she is doing well (has not been seen since starting). Appt that was on 07/16/2024 was cancelled and rescheduled to 08/27/2024  ATC patient - can provide her a sample. Left VM with details  Sherry Pennant, PharmD, MPH, BCPS, CPP Clinical Pharmacist [facility])

## 2024-07-14 NOTE — Telephone Encounter (Signed)
 Last Fill: 05/07/2024  Labs: 07/04/2024 Potassium 3.4 Rest of CBC and CMP WNL  TB Gold: 04/16/2024 TB Gold Negative   Next Visit: 08/27/2024  Last Visit: 04/16/2024  IK:Ednmpjupr arthritis   Current Dose per office note 04/16/2024: Tremfya  100 mg [place] every eight weeks.   Okay to refill Tremfyia?

## 2024-07-15 ENCOUNTER — Other Ambulatory Visit: Payer: Self-pay

## 2024-07-15 NOTE — Progress Notes (Signed)
 Patient had labs and prescription was sentin, but PA needed. Patient has been advised to pick up sample. Patient states in messages with [employer] that she will pick up sample Friday, 9/19.

## 2024-07-15 NOTE — Telephone Encounter (Signed)
 Patient will plan to come on Friday, 07/18/24 for Tremfya  sample

## 2024-07-16 ENCOUNTER — Other Ambulatory Visit (HOSPITAL_COMMUNITY): Payer: Self-pay

## 2024-07-16 ENCOUNTER — Ambulatory Visit: Admitting: Internal Medicine

## 2024-07-16 ENCOUNTER — Other Ambulatory Visit: Payer: Self-pay

## 2024-07-18 ENCOUNTER — Telehealth: Payer: Self-pay | Admitting: *Deleted

## 2024-07-18 ENCOUNTER — Other Ambulatory Visit (HOSPITAL_COMMUNITY): Payer: Self-pay

## 2024-07-18 MED ORDER — BUPROPION HCL ER (XL) 300 MG PO TB24
300.0000 mg | ORAL_TABLET | Freq: Every morning | ORAL | 0 refills | Status: DC
  Filled 2024-07-18: qty 90, 90d supply, fill #0

## 2024-07-18 NOTE — Telephone Encounter (Signed)
 Medication Samples have been provided to the patient.  Drug name: Tremfya  Strength: 100 mg/ml Qty: 1 LOT: [id number] Exp.Date: 2026.06  Dosing instructions: Inject 1 ml (100 MG total) ito the skin every 8 (eight) weeks.

## 2024-07-21 ENCOUNTER — Other Ambulatory Visit: Payer: Self-pay

## 2024-07-28 ENCOUNTER — Other Ambulatory Visit: Payer: Self-pay

## 2024-07-30 ENCOUNTER — Other Ambulatory Visit: Payer: Self-pay

## 2024-07-30 ENCOUNTER — Other Ambulatory Visit (HOSPITAL_COMMUNITY): Payer: Self-pay

## 2024-07-30 DIAGNOSIS — K582 Mixed irritable bowel syndrome: Secondary | ICD-10-CM | POA: Diagnosis not present

## 2024-07-30 DIAGNOSIS — I10 Essential (primary) hypertension: Secondary | ICD-10-CM | POA: Diagnosis not present

## 2024-07-30 DIAGNOSIS — F900 Attention-deficit hyperactivity disorder, predominantly inattentive type: Secondary | ICD-10-CM | POA: Diagnosis not present

## 2024-07-30 DIAGNOSIS — Z862 Personal history of diseases of the blood and blood-forming organs and certain disorders involving the immune mechanism: Secondary | ICD-10-CM | POA: Diagnosis not present

## 2024-07-30 DIAGNOSIS — F324 Major depressive disorder, single episode, in partial remission: Secondary | ICD-10-CM | POA: Diagnosis not present

## 2024-07-30 DIAGNOSIS — G43909 Migraine, unspecified, not intractable, without status migrainosus: Secondary | ICD-10-CM | POA: Diagnosis not present

## 2024-07-30 DIAGNOSIS — J4520 Mild intermittent asthma, uncomplicated: Secondary | ICD-10-CM | POA: Diagnosis not present

## 2024-07-30 DIAGNOSIS — E7800 Pure hypercholesterolemia, unspecified: Secondary | ICD-10-CM | POA: Diagnosis not present

## 2024-07-30 DIAGNOSIS — L4050 Arthropathic psoriasis, unspecified: Secondary | ICD-10-CM | POA: Diagnosis not present

## 2024-07-30 MED ORDER — BUPROPION HCL ER (XL) 300 MG PO TB24
300.0000 mg | ORAL_TABLET | Freq: Every morning | ORAL | 3 refills | Status: AC
  Filled 2024-07-30 – 2024-10-14 (×2): qty 90, 90d supply, fill #0

## 2024-07-30 MED ORDER — HYDROCHLOROTHIAZIDE 25 MG PO TABS
25.0000 mg | ORAL_TABLET | Freq: Every morning | ORAL | 3 refills | Status: AC
  Filled 2024-07-30 – 2024-09-30 (×2): qty 90, 90d supply, fill #0

## 2024-07-30 MED ORDER — SERTRALINE HCL 100 MG PO TABS
200.0000 mg | ORAL_TABLET | Freq: Every day | ORAL | 3 refills | Status: AC
  Filled 2024-07-30: qty 90, 90d supply, fill #0
  Filled 2024-10-08: qty 90, 45d supply, fill #0
  Filled 2024-11-22: qty 180, 90d supply, fill #1

## 2024-07-30 MED ORDER — LOSARTAN POTASSIUM 100 MG PO TABS
100.0000 mg | ORAL_TABLET | Freq: Every day | ORAL | 3 refills | Status: AC
  Filled 2024-07-30: qty 90, 90d supply, fill #0
  Filled 2024-10-29: qty 90, 90d supply, fill #1

## 2024-08-07 NOTE — Progress Notes (Signed)
 Patient restarting Tremfya  for PsA and PsO. She noticed worsening of nail symptoms and skin itching after discontinuation of Tremfya . Increased ridging and deterioration of fingernails and toenails, and hair loss, previously controlled with Tremfya .  Restart Tremfya  100mg  subcut at Week 0, Week 4, then every 8 weeks. Continue hydroxychlorquine (at reduced but weight-appropriate dosing)  Megan Tanner, PharmD, MPH, BCPS, CPP Clinical Pharmacist [facility])

## 2024-08-14 NOTE — Progress Notes (Signed)
 Office Visit Note  Patient: Megan Tanner             Date of Birth: 1966/05/11           MRN: [id number]             PCP: Arloa Elsie SAUNDERS, MD Referring: Arloa Elsie SAUNDERS, MD Visit Date: 08/27/2024   Subjective:  Psoriasis (Several spots on the body that are red thought it may be sunburn but Dermatology said its not. )   Discussed the use of AI scribe software for clinical note transcription with the patient, who gave verbal consent to proceed.  History of Present Illness   Megan Tanner is a 58 y.o. female here for follow up for psoriatic arthritis on hydroxychloroquine  for 400 mg daily and now on Tremfya  since about 3 months.   She experiences persistent joint pain and inflammation despite being on Tremfya  for three months, with no significant improvement in her joint pain or skin condition. Leflunomide previously provided the most relief for her hand pain, but she had an allergic reaction characterized by 'little bumps under my eyes' and skin stinging, leading to its discontinuation. She is currently taking Plaquenil , alternating between 200 mg and 400 mg daily due to its side effects, which she describes as a 'nasty pill when you break it in half'.  She describes a persistent red rash on her arm, initially thought to be sunburn. The rash is also present on other parts of her body but does not itch or burn. She reports that when she asked the dermatologist about the rash, he did not give her a straight answer, but mentioned it could be atherosclerosis. Additionally, her mouth has become 'musically dry again'.  She has ongoing issues with her feet, knees, hips, and back, which she thought might be related to her foot arch support. She purchased arch supports last week, but her body has not adjusted well, causing pain in her feet, knees, hips, and back.  She believes she has a sinus infection, initially thought to be related to a dental issue. She underwent a root canal, but the  symptoms persisted. She is currently on levofloxacin, which seems to have helped. She also reports fluid in her ear causing popping and associates her dizziness and balance issues with this.  She takes Allegra and Flonase for her allergies.        Previous HPI 04/16/2024 here for follow up for psoriatic arthritis on hydroxychloroquine  for 400 mg daily. She presents with worsening joint pain and skin symptoms.   She experiences persistent joint pain, particularly in her knees, hands, and wrists. A cortisone injection in her knee followed by a synvisc(?) injection initially caused swelling but improved the sensation of 'bone on bone'. However, significant discomfort persists, with pain radiating to her hip and sciatic nerve. Her knees and feet swell, especially at the end of the day, causing difficulty walking when her feet swell.   Her skin symptoms have worsened, including a cyclic rash, red patches, and bumps under her eyes that resolve spontaneously. Her fingernails and toenails have developed ridges and are causing holes in her socks. She notes hair loss and very dry skin, including her eyes.   She previously used Tremfya , which she believes helped with her symptoms, but has since discontinued it. Amitriptyline  was tried but caused significant sedation without noticeable improvement in symptoms.   She has been on hydroxychloroquine  for approximately 15 years, taking 400 mg daily. Attempts to discontinue  it in the past resulted in increased pain. She has not experienced any eye symptoms so far.   Her psoriasis is not as bad as it has been completely off treatment, but her nails and skin symptoms have worsened since stopping Tremfya . She experiences itching and discomfort in her skin and nails, and her eyes have been particularly bothersome.         Previous HPI 01/16/2024 Megan Tanner is a 58 y.o. female here for follow up for psoriatic arthritis on hydroxychloroquine  for 400 mg daily  presents with joint pain, particularly in the wrists, thumbs, neck, and hips. States that overall she has been doing about the same as she was doing in December. Her first second and fourth digits are hurting today and has been having difficulty gripping things. Her stiffness in the morning lasts a couple hours, and is unable to function if she does not take the ibuprofen  twice a day. She tried tumeric but did not find it helpful.    She is also having leg pain, primarily radiating from her spine.    Previous HPI 10/17/2023 Megan Tanner is a 58 y.o. female here for follow up for psoriatic arthritis on hydroxychloroquine  for 400 mg daily presents with worsening joint pain, particularly in the wrists, thumbs, neck, and hips. They report a history of a broken wrist, which is now experiencing similar pain, along with cracking and popping sensations. The patient also describes a radiating pain from the knee to the hip, suspected to be sciatic nerve-related. They have received a cortisone injection in the right knee a couple of months ago.   The patient also reports dry eyes and mouth. They also report difficulty sleeping due to pain, waking up every two hours. They have tried gabapentin in the past but discontinued due to dizziness. They have also tried Cymbalta  for fibromyalgia but did not notice a significant difference in pain levels.   The patient also mentions a recent sinus infection, for which they completed a course of Augmentin . They have tried taking turmeric supplements but discontinued due to nausea and vomiting. They also report worsening nail conditions, with toenails raising up and causing holes in socks.      Previous HPI 06/11/2023 Megan Tanner is a 58 y.o. female here for follow up for psoriatic arthritis on hydroxychloroquine  for 400 mg daily and Tremfya  100 mg every 8 weeks since starting in February.  Currently feels symptoms are not well-controlled.  Pain is worse after activity  and severely limiting her exercise.  She thought there was initial benefit in joint pain and stiffness with the start of Tremfya  and definite decrease in skin rashes.  However after the first 2 doses feels the symptoms were not getting back more to at baseline. She had breakout of painful rash on her right breast similar to previous shingles at this area. Subsequently also with some rash around the eyes that was new not particularly associated by timing with the Tremfya  injections.  Currently having joint pain most regularly in her fingers and the anterior portion of both feet.  Right knee pain wearing a brace for this had steroid injection with partial benefit.   Previous HPI 03/14/2023 Megan Tanner is a 58 y.o. female here for follow up for psoriatic arthritis on hydroxychloroquine  for 400 mg daily and after starting Tremfya  100 mg in February now at maintenance scheduled every 8 weeks.  Within the first month after starting she saw clearance of skin rashes on her  elbows and groin area completely.  Has still had persistent joint issues had x-ray and a steroid injection in the right shoulder.  Also had a Synvisc injection in the right knee about a month ago for osteoarthritis.  Still has some persistent pain in that area specially has to take caution with climbing stairs and is wearing knee braces for support.  Also with some right ankle pain.  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   Previous HPI 12/13/22 Megan Tanner is a 58 y.o. female here for follow up for inflammatory arthritis on HCQ 400 mg daily and ibuprofen  800 mg PRN. Symptoms remain about the same as last time. Lab testing was all negative except mildly elevated cholesterol. 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    Previous HPI 11/08/22 Megan Tanner is a 58 y.o. female here  for second opinion for psoriatic arthritis. She was previously seeing Dr. Leni for management including plaquenil  400 mg daily. She has taken multiple previous treatments most recently Orencia  with incomplete symptom improvement.  She is also using ibuprofen  800 mg as needed for breakthrough inflammatory and osteoarthritis pain.  She has tried several longer acting or prescription or topical NSAIDs all of which were not as effective as the high-dose ibuprofen . She has been having at least 5 or 6 years of persistent pain affecting her hands with worsening bony nodules with episodic redness swelling and nail changes. She has fingernail ridges with some nail discoloration and separation in the great toe.  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  So far oral and topical medications have not improved these rashes.  They are not painful or itchy. She has had a chronic injury to the right knee that occurred falling down stairs and sees Dr. Shari for long-term management has had MRIs but trying to avoid surgery.  Also has received previous viscosupplementation injection series. She did not have great benefit from these. Steroid shots have been helpful for at least a month or more but not especially long either.    DMARD Hx Methotrexate - Rashes Leflunomide - Intolerance Humira  - Incomplete response Remicade - Incomplete response Xeljanz - Nonresponder Cosentyx  - Nonresponder Orencia  - Nonresponder     Review of Systems  Constitutional:  Positive for fatigue.  HENT:  Positive for mouth sores and mouth dryness.   Eyes:  Positive for dryness.  Respiratory:  Negative for shortness of breath.   Cardiovascular:  Negative for chest pain and palpitations.  Gastrointestinal:  Negative for blood in stool, constipation and diarrhea.  Endocrine: Negative for increased urination.   Genitourinary:  Negative for involuntary urination.  Musculoskeletal:  Positive for joint pain, joint pain, joint swelling, morning stiffness and muscle tenderness. Negative for gait problem, myalgias, muscle weakness and myalgias.  Skin:  Positive for color change, redness and sensitivity to sunlight. Negative for rash and hair loss.  Allergic/Immunologic: Positive for susceptible to infections.  Neurological:  Positive for dizziness and headaches.  Hematological:  Negative for swollen glands.  Psychiatric/Behavioral:  Positive for depressed mood and sleep disturbance. The patient is not nervous/anxious.     PMFS History:  Patient Active Problem List   Diagnosis Date Noted   High risk medication use 12/13/2022  Chronic fatigue 07/06/2020   Fibromyalgia affecting multiple sites 07/06/2020   Hypertension 07/06/2020   IBS (irritable bowel syndrome) 07/06/2020   Migraines 07/06/2020   Osteoarthritis 07/06/2020   Psoriatic arthritis (HCC) 07/06/2020   Restless leg syndrome 07/06/2020    Past Medical History:  Diagnosis Date   Allergy    Arthritis    Dry eyes    Dry mouth    Fatigue    Fibromyalgia    Hypertension    IBS (irritable bowel syndrome)    Migraines    Seizures (HCC)    as  a baby from fever    Family History  Problem Relation Age of Onset   Osteoarthritis Mother    Hypertension Mother    Breast cancer Mother    Macular degeneration Mother    Heart disease Mother    COPD Mother    Glaucoma Mother    Osteoarthritis Father    Glaucoma Father    Hypertension Father    Stroke Father    Parkinson's disease Father    Dementia Father    Psoriasis Son    Colon cancer Neg Hx    Colon polyps Neg Hx    Esophageal cancer Neg Hx    Rectal cancer Neg Hx    Stomach cancer Neg Hx    Past Surgical History:  Procedure Laterality Date   ABDOMINAL HYSTERECTOMY     c sections     1990 1993   COLONOSCOPY     KNEE SURGERY     KNEE SURGERY Right     has had 6 knee  surgery   SHOULDER SURGERY     Social History   Social History Narrative   Not on file   Immunization History  Administered Date(s) Administered   PFIZER(Purple Top)SARS-COV-2 Vaccination 10/28/2019, 11/24/2019   Tdap 12/14/2023     Objective: Vital Signs: BP 115/71   Pulse 88   Temp 97.7 F (36.5 C)   Resp 16   Ht 5' (1.524 m)   Wt 158 lb 3.2 oz (71.8 kg)   BMI 30.90 kg/m    Physical Exam Eyes:     Conjunctiva/sclera: Conjunctivae normal.  Cardiovascular:     Rate and Rhythm: Normal rate and regular rhythm.  Pulmonary:     Effort: Pulmonary effort is normal.     Breath sounds: Normal breath sounds.  Lymphadenopathy:     Cervical: No cervical adenopathy.  Skin:    General: Skin is warm and dry.     Comments: Redness on arm, multiple spots, not pruritic or burning  Neurological:     Mental Status: She is alert.  Psychiatric:        Mood and Affect: Mood normal.      Musculoskeletal Exam:  Shoulders full ROM no tenderness or swelling Elbows full ROM no tenderness or swelling Wrists full ROM no tenderness or swelling Thumbs painful, CMC squaring and MCp hyperextenstion, reversible subluxation, right 2nd-3rd DIPs heberdons ndes with painful tenderness Low back b/l paraspinal muscle tenderness to pressure Hip normal internal and external rotation, lateral pain with rotation and tenderness to pressure Knees full ROM, patellofemoral crepitus, right knee brace Ankles full ROM no tenderness or swelling MTPs full ROM, tenderness to squeeze and with flexion/extension in 1st-2nd toes  Investigation: No additional findings.  Imaging: No results found.  Recent Labs: Lab Results  Component Value Date   WBC 7.4 07/04/2024   HGB 14.7 07/04/2024   PLT 299 07/04/2024   NA 140 07/04/2024  K 3.4 (L) 07/04/2024   CL 100 07/04/2024   CO2 27 07/04/2024   GLUCOSE 100 07/04/2024   BUN 22 07/04/2024   CREATININE 0.80 07/04/2024   BILITOT 0.4 07/04/2024   AST 15  07/04/2024   ALT 15 07/04/2024   PROT 6.5 07/04/2024   CALCIUM 9.2 07/04/2024   QFTBGOLDPLUS NEGATIVE 04/16/2024    Speciality Comments: PLQ Eye Exam: 04/16/2024 Normal  @[facility] f/u 1 year  Procedures:  No procedures performed Allergies: Codeine, Erythromycin, Fluticasone, Lisinopril, Methotrexate, Sulfa antibiotics, Sumatriptan, Tramadol, and Tyloxapol   Assessment / Plan:     Visit Diagnoses: Psoriatic arthritis (HCC) - Plan: Guselkumab  100 MG/ML SOPN Persistent joint and skin symptoms despite three months of Tremfya . No significant improvement. Previous relief with leflunomide but allergic reaction occurred. Plaquenil  dosage adjusted due to side effects. Tremfya  expected to improve joint symptoms with continued use. - Continue Tremfya  for at least six months. - Maintain Plaquenil  at 300 mg daily, alternating 200 mg and 400 mg doses.  High risk medication use - hydroxychloroquine  400 mg daily  Primary osteoarthritis involving multiple joints Chronic osteoarthritis with significant pain and functional impairment. Hyperextension and Z-shaped deformity of thumb noted. Discussed potential surgical intervention for thumb arthritis, involving trapezium removal and tendon replacement. Surgery could reduce pain but involves recovery and potential loss of motion. Considered long-term option.  Fibromyalgia affecting multiple sites  Chronic sinusitis Persistent sinus symptoms despite previous dental intervention. Levofloxacin seems to be helping. Symptoms include fluid in ear causing popping sensation. Allergies managed with Allegra and Flonase. - Continue levofloxacin for sinusitis.        Orders: No orders of the defined types were placed in this encounter.  Meds ordered this encounter  Medications   hydroxychloroquine  (PLAQUENIL ) 200 MG tablet    Sig: Take 1.5 tablets (300 mg total) by mouth daily.    Dispense:  135 tablet    Refill:  1   Guselkumab  100 MG/ML  SOPN    Sig: Inject 100 mg into the skin every 8 (eight) weeks.    Dispense:  1 mL    Refill:  1     Follow-Up Instructions: Return in about 3 months (around 11/27/2024) for PsA on GUS/HCQ f/u 3mos.   Lonni LELON Ester, MD  Note - This record has been created using [employer].  Chart creation errors have been sought, but may not always  have been located. Such creation errors do not reflect on  the standard of medical care.

## 2024-08-15 ENCOUNTER — Other Ambulatory Visit (HOSPITAL_COMMUNITY): Payer: Self-pay

## 2024-08-15 MED ORDER — LEVOFLOXACIN 500 MG PO TABS
500.0000 mg | ORAL_TABLET | Freq: Every day | ORAL | 0 refills | Status: AC
  Filled 2024-08-15: qty 10, 10d supply, fill #0

## 2024-08-16 ENCOUNTER — Other Ambulatory Visit (HOSPITAL_COMMUNITY): Payer: Self-pay

## 2024-08-20 DIAGNOSIS — D235 Other benign neoplasm of skin of trunk: Secondary | ICD-10-CM | POA: Diagnosis not present

## 2024-08-20 DIAGNOSIS — Z85828 Personal history of other malignant neoplasm of skin: Secondary | ICD-10-CM | POA: Diagnosis not present

## 2024-08-20 DIAGNOSIS — L821 Other seborrheic keratosis: Secondary | ICD-10-CM | POA: Diagnosis not present

## 2024-08-22 DIAGNOSIS — Z862 Personal history of diseases of the blood and blood-forming organs and certain disorders involving the immune mechanism: Secondary | ICD-10-CM | POA: Diagnosis not present

## 2024-08-22 DIAGNOSIS — E7800 Pure hypercholesterolemia, unspecified: Secondary | ICD-10-CM | POA: Diagnosis not present

## 2024-08-27 ENCOUNTER — Other Ambulatory Visit (HOSPITAL_COMMUNITY): Payer: Self-pay

## 2024-08-27 ENCOUNTER — Encounter: Payer: Self-pay | Admitting: Internal Medicine

## 2024-08-27 ENCOUNTER — Ambulatory Visit: Attending: Internal Medicine | Admitting: Internal Medicine

## 2024-08-27 ENCOUNTER — Other Ambulatory Visit: Payer: Self-pay

## 2024-08-27 VITALS — BP 115/71 | HR 88 | Temp 97.7°F | Resp 16 | Ht 60.0 in | Wt 158.2 lb

## 2024-08-27 DIAGNOSIS — M797 Fibromyalgia: Secondary | ICD-10-CM | POA: Diagnosis not present

## 2024-08-27 DIAGNOSIS — Z79899 Other long term (current) drug therapy: Secondary | ICD-10-CM | POA: Diagnosis not present

## 2024-08-27 DIAGNOSIS — L4050 Arthropathic psoriasis, unspecified: Secondary | ICD-10-CM | POA: Diagnosis not present

## 2024-08-27 DIAGNOSIS — M069 Rheumatoid arthritis, unspecified: Secondary | ICD-10-CM

## 2024-08-27 DIAGNOSIS — M150 Primary generalized (osteo)arthritis: Secondary | ICD-10-CM

## 2024-08-27 MED ORDER — GUSELKUMAB 100 MG/ML ~~LOC~~ SOPN
100.0000 mg | PEN_INJECTOR | SUBCUTANEOUS | 1 refills | Status: AC
  Filled 2024-08-27: qty 1, fill #0
  Filled 2024-08-28: qty 1, 56d supply, fill #0
  Filled 2024-09-01 – 2024-09-03 (×2): qty 1, 28d supply, fill #0
  Filled 2024-09-04: qty 1, 56d supply, fill #0
  Filled 2024-10-29: qty 1, 56d supply, fill #1

## 2024-08-27 MED ORDER — HYDROXYCHLOROQUINE SULFATE 200 MG PO TABS
300.0000 mg | ORAL_TABLET | Freq: Every day | ORAL | 1 refills | Status: AC
  Filled 2024-08-27 – 2024-10-08 (×2): qty 135, 90d supply, fill #0

## 2024-08-28 ENCOUNTER — Other Ambulatory Visit: Payer: Self-pay

## 2024-08-28 ENCOUNTER — Other Ambulatory Visit (HOSPITAL_COMMUNITY): Payer: Self-pay

## 2024-08-28 ENCOUNTER — Other Ambulatory Visit: Payer: Self-pay | Admitting: Pharmacy Technician

## 2024-08-28 NOTE — Progress Notes (Signed)
 PA submission pending completion of chart notes from yesterday (10/29)

## 2024-09-01 ENCOUNTER — Other Ambulatory Visit (HOSPITAL_COMMUNITY): Payer: Self-pay

## 2024-09-01 ENCOUNTER — Telehealth: Payer: Self-pay

## 2024-09-01 ENCOUNTER — Other Ambulatory Visit: Payer: Self-pay

## 2024-09-01 NOTE — Progress Notes (Signed)
 Initiated PA over phone. Clinicals sent via fax. Turnaround time is 24-72 hours  Phone: [phone] Fax: [phone]

## 2024-09-01 NOTE — Telephone Encounter (Signed)
 Submitted an URGENT Prior Authorization RENEWAL request to [employer] for TREMFYA  via CoverMyMeds. Will update once we receive a response.  Key: [id number]   Per automated response: Duplicate PA Case ID detected  Initiated PA over phone. Clinicals sent via fax. Turnaround time is 24-72 hours  Phone: [phone] Fax: [phone]

## 2024-09-01 NOTE — Progress Notes (Signed)
 Submitted an URGENT Prior Authorization RENEWAL request to [facility] for TREMFYA  via CoverMyMeds. Will update once we receive a response.  Key: [id number]  [id number]

## 2024-09-03 ENCOUNTER — Other Ambulatory Visit (HOSPITAL_COMMUNITY): Payer: Self-pay

## 2024-09-03 ENCOUNTER — Other Ambulatory Visit: Payer: Self-pay

## 2024-09-03 NOTE — Telephone Encounter (Signed)
 PA form received from [employer]. Completed and faxed back again

## 2024-09-03 NOTE — Telephone Encounter (Signed)
 Received notification from [facility] regarding a prior authorization for TREMFYA . Authorization has been APPROVED from 09/03/2024 to 09/02/2025. Approval letter sent to scan center.  Patient must continue to fill through [facility]: [phone]   Authorization # [id number]  Sherry Pennant, PharmD, MPH, BCPS, CPP Clinical Pharmacist [facility])

## 2024-09-03 NOTE — Progress Notes (Signed)
 PA for Tremfya  approved. Please reprocess Tremfya 

## 2024-09-04 ENCOUNTER — Other Ambulatory Visit (HOSPITAL_COMMUNITY): Payer: Self-pay

## 2024-09-04 ENCOUNTER — Other Ambulatory Visit: Payer: Self-pay

## 2024-09-04 ENCOUNTER — Encounter (INDEPENDENT_AMBULATORY_CARE_PROVIDER_SITE_OTHER): Payer: Self-pay

## 2024-09-04 NOTE — Progress Notes (Signed)
 Specialty Pharmacy Refill Coordination Note  Megan Tanner is a 58 y.o. female contacted today regarding refills of specialty medication(s) Guselkumab  (Tremfya  [facility])   Patient requested (Patient-Rptd) Delivery   Delivery date: 09/05/2024 or 09/09/2024 (UPS) Verified address: (Patient-Rptd) [address] [place], [place] [address]   Medication will be filled on: 09/04/2024

## 2024-09-08 ENCOUNTER — Other Ambulatory Visit (HOSPITAL_COMMUNITY): Payer: Self-pay

## 2024-09-08 ENCOUNTER — Other Ambulatory Visit

## 2024-09-08 ENCOUNTER — Encounter (HOSPITAL_COMMUNITY): Payer: Self-pay

## 2024-09-09 ENCOUNTER — Other Ambulatory Visit: Payer: Self-pay

## 2024-09-15 ENCOUNTER — Ambulatory Visit
Admission: RE | Admit: 2024-09-15 | Discharge: 2024-09-15 | Disposition: A | Source: Ambulatory Visit | Attending: Obstetrics and Gynecology | Admitting: Obstetrics and Gynecology

## 2024-09-15 DIAGNOSIS — Z8249 Family history of ischemic heart disease and other diseases of the circulatory system: Secondary | ICD-10-CM

## 2024-09-30 ENCOUNTER — Other Ambulatory Visit: Payer: Self-pay

## 2024-09-30 ENCOUNTER — Other Ambulatory Visit (HOSPITAL_COMMUNITY): Payer: Self-pay

## 2024-10-08 ENCOUNTER — Other Ambulatory Visit (HOSPITAL_COMMUNITY): Payer: Self-pay

## 2024-10-09 ENCOUNTER — Other Ambulatory Visit: Payer: Self-pay

## 2024-10-15 ENCOUNTER — Other Ambulatory Visit: Payer: Self-pay

## 2024-10-15 ENCOUNTER — Other Ambulatory Visit (HOSPITAL_COMMUNITY): Payer: Self-pay

## 2024-10-21 ENCOUNTER — Other Ambulatory Visit (HOSPITAL_COMMUNITY): Payer: Self-pay

## 2024-10-29 ENCOUNTER — Other Ambulatory Visit: Payer: Self-pay

## 2024-10-29 ENCOUNTER — Other Ambulatory Visit (HOSPITAL_COMMUNITY): Payer: Self-pay

## 2024-10-29 NOTE — Progress Notes (Signed)
 Specialty Pharmacy Refill Coordination Note  Megan Tanner is a 58 y.o. female contacted today regarding refills of specialty medication(s) Guselkumab    Patient requested Delivery   Delivery date: 11/07/24   Verified address: [address] [place], [place] [address]   Medication will be filled on: 11/06/24

## 2024-10-31 ENCOUNTER — Other Ambulatory Visit: Payer: Self-pay

## 2024-11-01 ENCOUNTER — Other Ambulatory Visit (HOSPITAL_BASED_OUTPATIENT_CLINIC_OR_DEPARTMENT_OTHER): Payer: Self-pay

## 2024-11-01 ENCOUNTER — Other Ambulatory Visit: Payer: Self-pay

## 2024-11-03 ENCOUNTER — Other Ambulatory Visit: Payer: Self-pay

## 2024-11-06 ENCOUNTER — Other Ambulatory Visit: Payer: Self-pay

## 2024-11-13 ENCOUNTER — Other Ambulatory Visit (HOSPITAL_COMMUNITY): Payer: Self-pay

## 2024-11-14 ENCOUNTER — Other Ambulatory Visit: Payer: Self-pay

## 2024-11-14 ENCOUNTER — Other Ambulatory Visit (HOSPITAL_COMMUNITY): Payer: Self-pay

## 2024-11-14 MED ORDER — IBUPROFEN 800 MG PO TABS
800.0000 mg | ORAL_TABLET | Freq: Three times a day (TID) | ORAL | 0 refills | Status: AC
  Filled 2024-11-14: qty 270, 90d supply, fill #0

## 2024-11-17 ENCOUNTER — Other Ambulatory Visit: Payer: Self-pay

## 2024-11-21 NOTE — Progress Notes (Unsigned)
 "  Office Visit Note  Patient: Megan Tanner             Date of Birth: [birth year]           MRN: [id number]             PCP: Arloa Elsie SAUNDERS, MD Referring: Arloa Elsie SAUNDERS, MD Visit Date: 12/03/2024   Subjective:  Medical Management of Chronic Issues (Nails still separating and rash on belly )   History of Present Illness: Megan Tanner is a 59 y.o. female here for follow up for psoriatic arthritis on hydroxychloroquine  for 400 mg daily and now on Tremfya  since about 3 months.    Discussed the use of AI scribe software for clinical note transcription with the patient, who gave verbal consent to proceed.  History of Present Illness   Megan Tanner is a 59 year old female with psoriatic arthritis who presents with persistent rashes and nail changes. She is accompanied by her father.  She has persistent rashes and nail changes despite ongoing treatment with Tremfya  for her psoriatic arthritis. A rash on her upper arm, initially thought to be sunburn, has not resolved for months. Additional rashes are present above and below her eyes and on her stomach, which have not improved as they did previously with Tremfya .  Her fingernails and toenails are worsening, with separation occurring, particularly on the fourth toe of both feet and the big toe, where the changes initially started. The tip of the big toe feels 'a little bit red.'  There is no significant improvement in her symptoms overall, and she has difficulty using her hands, particularly in the morning when she cannot make a fist. Her ability to use her hands improves slightly as the day progresses.  She had a respiratory illness before her last visit, which affected her sinuses and required treatment.  No issues with Tremfya  injections.     ***  Previous HPI 08/27/2024 Megan Tanner is a 59 y.o. female here for follow up for psoriatic arthritis on hydroxychloroquine  for 400 mg daily and now on Tremfya  since about 3 months.     She experiences persistent joint pain and inflammation despite being on Tremfya  for three months, with no significant improvement in her joint pain or skin condition. Leflunomide previously provided the most relief for her hand pain, but she had an allergic reaction characterized by 'little bumps under my eyes' and skin stinging, leading to its discontinuation. She is currently taking Plaquenil , alternating between 200 mg and 400 mg daily due to its side effects, which she describes as a 'nasty pill when you break it in half'.   She describes a persistent red rash on her arm, initially thought to be sunburn. The rash is also present on other parts of her body but does not itch or burn. She reports that when she asked the dermatologist about the rash, he did not give her a straight answer, but mentioned it could be atherosclerosis. Additionally, her mouth has become 'musically dry again'.   She has ongoing issues with her feet, knees, hips, and back, which she thought might be related to her foot arch support. She purchased arch supports last week, but her body has not adjusted well, causing pain in her feet, knees, hips, and back.   She believes she has a sinus infection, initially thought to be related to a dental issue. She underwent a root canal, but the symptoms persisted. She is currently on levofloxacin , which  seems to have helped. She also reports fluid in her ear causing popping and associates her dizziness and balance issues with this.   She takes Allegra and Flonase for her allergies.           Previous HPI 04/16/2024 here for follow up for psoriatic arthritis on hydroxychloroquine  for 400 mg daily. She presents with worsening joint pain and skin symptoms.   She experiences persistent joint pain, particularly in her knees, hands, and wrists. A cortisone injection in her knee followed by a synvisc(?) injection initially caused swelling but improved the sensation of 'bone on bone'.  However, significant discomfort persists, with pain radiating to her hip and sciatic nerve. Her knees and feet swell, especially at the end of the day, causing difficulty walking when her feet swell.   Her skin symptoms have worsened, including a cyclic rash, red patches, and bumps under her eyes that resolve spontaneously. Her fingernails and toenails have developed ridges and are causing holes in her socks. She notes hair loss and very dry skin, including her eyes.   She previously used Tremfya , which she believes helped with her symptoms, but has since discontinued it. Amitriptyline  was tried but caused significant sedation without noticeable improvement in symptoms.   She has been on hydroxychloroquine  for approximately 15 years, taking 400 mg daily. Attempts to discontinue it in the past resulted in increased pain. She has not experienced any eye symptoms so far.   Her psoriasis is not as bad as it has been completely off treatment, but her nails and skin symptoms have worsened since stopping Tremfya . She experiences itching and discomfort in her skin and nails, and her eyes have been particularly bothersome.         Previous HPI 01/16/2024 Megan Tanner is a 59 y.o. female here for follow up for psoriatic arthritis on hydroxychloroquine  for 400 mg daily presents with joint pain, particularly in the wrists, thumbs, neck, and hips. States that overall she has been doing about the same as she was doing in December. Her first second and fourth digits are hurting today and has been having difficulty gripping things. Her stiffness in the morning lasts a couple hours, and is unable to function if she does not take the ibuprofen  twice a day. She tried tumeric but did not find it helpful.    She is also having leg pain, primarily radiating from her spine.    Previous HPI 10/17/2023 Megan Tanner is a 59 y.o. female here for follow up for psoriatic arthritis on hydroxychloroquine  for 400 mg daily  presents with worsening joint pain, particularly in the wrists, thumbs, neck, and hips. They report a history of a broken wrist, which is now experiencing similar pain, along with cracking and popping sensations. The patient also describes a radiating pain from the knee to the hip, suspected to be sciatic nerve-related. They have received a cortisone injection in the right knee a couple of months ago.   The patient also reports dry eyes and mouth. They also report difficulty sleeping due to pain, waking up every two hours. They have tried gabapentin in the past but discontinued due to dizziness. They have also tried Cymbalta  for fibromyalgia but did not notice a significant difference in pain levels.   The patient also mentions a recent sinus infection, for which they completed a course of Augmentin . They have tried taking turmeric supplements but discontinued due to nausea and vomiting. They also report worsening nail conditions, with toenails raising up  and causing holes in socks.      Previous HPI 06/11/2023 Megan Tanner is a 59 y.o. female here for follow up for psoriatic arthritis on hydroxychloroquine  for 400 mg daily and Tremfya  100 mg every 8 weeks since starting in February.  Currently feels symptoms are not well-controlled.  Pain is worse after activity and severely limiting her exercise.  She thought there was initial benefit in joint pain and stiffness with the start of Tremfya  and definite decrease in skin rashes.  However after the first 2 doses feels the symptoms were not getting back more to at baseline. She had breakout of painful rash on her right breast similar to previous shingles at this area. Subsequently also with some rash around the eyes that was new not particularly associated by timing with the Tremfya  injections.  Currently having joint pain most regularly in her fingers and the anterior portion of both feet.  Right knee pain wearing a brace for this had steroid injection with  partial benefit.   Previous HPI 03/14/2023 Megan Tanner is a 59 y.o. female here for follow up for psoriatic arthritis on hydroxychloroquine  for 400 mg daily and after starting Tremfya  100 mg in February now at maintenance scheduled every 8 weeks.  Within the first month after starting she saw clearance of skin rashes on her elbows and groin area completely.  Has still had persistent joint issues had x-ray and a steroid injection in the right shoulder.  Also had a Synvisc injection in the right knee about a month ago for osteoarthritis.  Still has some persistent pain in that area specially has to take caution with climbing stairs and is wearing knee braces for support.  Also with some right ankle pain.  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   Previous HPI 12/13/22 Megan Tanner is a 59 y.o. female here for follow up for inflammatory arthritis on HCQ 400 mg daily and ibuprofen  800 mg PRN. Symptoms remain about the same as last time. Lab testing was all negative except mildly elevated cholesterol. 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    Previous HPI 11/08/22 Megan Tanner is a 59 y.o. female here for second opinion for psoriatic arthritis. She was previously seeing Dr. Leni for management including plaquenil  400 mg daily. She has taken multiple previous treatments most recently Orencia  with incomplete symptom improvement.  She is also using ibuprofen  800 mg as needed for breakthrough inflammatory and osteoarthritis pain.  She has tried several longer acting or prescription or topical NSAIDs all of which were not as effective as the high-dose ibuprofen . She has been having at least 5 or 6 years of persistent pain affecting her hands with worsening bony nodules with episodic redness swelling and nail changes. She has fingernail ridges with  some nail discoloration and separation in the great toe.  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  So far oral and topical medications have not improved these rashes.  They are not painful or itchy. She has had a chronic injury to the right knee that occurred falling down stairs and sees Dr. Shari for long-term management has had MRIs but trying to avoid surgery.  Also has received previous viscosupplementation injection series. She  did not have great benefit from these. Steroid shots have been helpful for at least a month or more but not especially long either.    DMARD Hx Tremfya  - Limited response Methotrexate - Rashes Leflunomide - Intolerance Humira  - Incomplete response Remicade - Incomplete response Xeljanz - Nonresponder Cosentyx  - Nonresponder Orencia  - Nonresponder   Review of Systems  Constitutional:  Positive for fatigue.  HENT:  Positive for mouth dryness. Negative for mouth sores.   Eyes:  Positive for dryness.  Respiratory:  Negative for shortness of breath.   Cardiovascular:  Negative for chest pain and palpitations.  Gastrointestinal:  Negative for blood in stool, constipation and diarrhea.  Endocrine: Negative for increased urination.  Genitourinary:  Negative for involuntary urination.  Musculoskeletal:  Positive for joint pain, joint pain, joint swelling, morning stiffness and muscle tenderness. Negative for gait problem, myalgias, muscle weakness and myalgias.  Skin:  Positive for color change and rash. Negative for hair loss and sensitivity to sunlight.  Allergic/Immunologic: Negative for susceptible to infections.  Neurological:  Positive for headaches. Negative for dizziness.  Hematological:  Negative for swollen glands.  Psychiatric/Behavioral:  Positive for depressed mood and sleep disturbance. The patient is not nervous/anxious.      PMFS History:  Patient Active Problem List   Diagnosis Date Noted   High risk medication use 12/13/2022   Chronic fatigue 07/06/2020   Fibromyalgia affecting multiple sites 07/06/2020   Hypertension 07/06/2020   IBS (irritable bowel syndrome) 07/06/2020   Migraines 07/06/2020   Osteoarthritis 07/06/2020   Psoriatic arthritis (HCC) 07/06/2020   Restless leg syndrome 07/06/2020    Past Medical History:  Diagnosis Date   Allergy    Arthritis    Dry eyes    Dry mouth    Fatigue    Fibromyalgia    Hypertension    IBS (irritable bowel syndrome)    Migraines    Seizures (HCC)    as  a baby from fever    Family History  Problem Relation Age of Onset   Osteoarthritis Mother    Hypertension Mother    Breast cancer Mother    Macular degeneration Mother    Heart disease Mother    COPD Mother    Glaucoma Mother    Osteoarthritis Father    Glaucoma Father    Hypertension Father    Stroke Father    Parkinson's disease Father    Dementia Father    Psoriasis Son    Colon cancer Neg Hx    Colon polyps Neg Hx    Esophageal cancer Neg Hx    Rectal cancer Neg Hx    Stomach cancer Neg Hx    Past Surgical History:  Procedure Laterality Date   ABDOMINAL HYSTERECTOMY     c sections     1990 1993   COLONOSCOPY     KNEE SURGERY     KNEE SURGERY Right     has had 6 knee surgery   SHOULDER SURGERY     Social History   Social History Narrative   Not on file   Immunization History  Administered Date(s) Administered   PFIZER(Purple Top)SARS-COV-2 Vaccination 10/28/2019, 11/24/2019   Tdap 12/14/2023     Objective: Vital Signs: BP 127/84   Pulse 99   Temp 97.7 F (36.5 C)   Resp 16   Ht 5' (1.524 m)   Wt 138 lb (62.6 kg)   BMI 26.95 kg/m    Physical Exam   Musculoskeletal Exam: ***  Nails  left 2nd right 2st and 4th Left forearm erythematous rash Lower abdomen erythematous rashes [place]   Investigation: No additional findings.  Imaging: No results  found.  Recent Labs: Lab Results  Component Value Date   WBC 7.4 07/04/2024   HGB 14.7 07/04/2024   PLT 299 07/04/2024   NA 140 07/04/2024   K 3.4 (L) 07/04/2024   CL 100 07/04/2024   CO2 27 07/04/2024   GLUCOSE 100 07/04/2024   BUN 22 07/04/2024   CREATININE 0.80 07/04/2024   BILITOT 0.4 07/04/2024   AST 15 07/04/2024   ALT 15 07/04/2024   PROT 6.5 07/04/2024   CALCIUM 9.2 07/04/2024   QFTBGOLDPLUS NEGATIVE 04/16/2024    Speciality Comments: PLQ Eye Exam: 04/16/2024 Normal  @[facility]  Ophthalmology f/u 1 year  Procedures:  No procedures performed Allergies: Codeine, Erythromycin, Fluticasone, Lisinopril, Methotrexate, Sulfa antibiotics, Sumatriptan, Tramadol, and Tyloxapol   Assessment / Plan:     Visit Diagnoses:  Assessment & Plan Psoriatic arthritis (HCC)  Orders:   Sedimentation rate  High risk medication use  Orders:   CBC with Differential/Platelet   Comprehensive metabolic panel with GFR  Primary osteoarthritis involving multiple joints     Fibromyalgia affecting multiple sites      ***  Assessment and Plan    Psoriatic arthritis with skin and nail involvement Persistent skin and nail symptoms despite Tremfya . Considering Bimzelx, targeting IL-17, due to its efficacy in similar cases. Bimzelx has a comparable safety profile to Cosentyx  and Taltz, with potential fungal infection risks. Improved insurance coverage noted. - Initiate Bimzelx with loading dose, then monthly maintenance. - Perform prior authorization for Bimzelx before March.        Follow-Up Instructions: Return in about 3 months (around 03/02/2025) for PsA Bimelx switch f/u 3mos.   Lonni LELON Ester, MD  Note - This record has been created using [employer].  Chart creation errors have been sought, but may not always  have been located. Such creation errors do not reflect on  the standard of medical care. "

## 2024-11-21 NOTE — Assessment & Plan Note (Addendum)
 SABRA

## 2024-11-21 NOTE — Assessment & Plan Note (Addendum)
" °  Orders:   Sedimentation rate  "

## 2024-11-21 NOTE — Assessment & Plan Note (Addendum)
  Orders:   CBC with Differential/Platelet   Comprehensive metabolic panel with GFR

## 2024-11-23 ENCOUNTER — Other Ambulatory Visit (HOSPITAL_COMMUNITY): Payer: Self-pay

## 2024-11-23 ENCOUNTER — Other Ambulatory Visit: Payer: Self-pay

## 2024-11-26 ENCOUNTER — Other Ambulatory Visit: Payer: Self-pay

## 2024-11-26 ENCOUNTER — Encounter: Payer: Self-pay | Admitting: Internal Medicine

## 2024-12-03 ENCOUNTER — Encounter: Payer: Self-pay | Admitting: Internal Medicine

## 2024-12-03 ENCOUNTER — Ambulatory Visit: Payer: Self-pay | Admitting: Internal Medicine

## 2024-12-03 VITALS — BP 127/84 | HR 99 | Temp 97.7°F | Resp 16 | Ht 60.0 in | Wt 138.0 lb

## 2024-12-03 DIAGNOSIS — L4050 Arthropathic psoriasis, unspecified: Secondary | ICD-10-CM

## 2024-12-03 DIAGNOSIS — Z79899 Other long term (current) drug therapy: Secondary | ICD-10-CM

## 2024-12-03 DIAGNOSIS — M150 Primary generalized (osteo)arthritis: Secondary | ICD-10-CM

## 2024-12-03 DIAGNOSIS — M797 Fibromyalgia: Secondary | ICD-10-CM

## 2024-12-03 LAB — CBC WITH DIFFERENTIAL/PLATELET
Absolute Lymphocytes: 2280 {cells}/uL (ref 850–3900)
Absolute Monocytes: 623 {cells}/uL (ref 200–950)
Basophils Absolute: 49 {cells}/uL (ref 0–200)
Basophils Relative: 0.6 %
Eosinophils Absolute: 254 {cells}/uL (ref 15–500)
Eosinophils Relative: 3.1 %
HCT: 44.9 % (ref 35.9–46.0)
Hemoglobin: 15.1 g/dL (ref 11.7–15.5)
MCH: 31 pg (ref 27.0–33.0)
MCHC: 33.6 g/dL (ref 31.6–35.4)
MCV: 92.2 fL (ref 81.4–101.7)
MPV: 11.4 fL (ref 7.5–12.5)
Monocytes Relative: 7.6 %
Neutro Abs: 4994 {cells}/uL (ref 1500–7800)
Neutrophils Relative %: 60.9 %
Platelets: 263 10*3/uL (ref 140–400)
RBC: 4.87 Million/uL (ref 3.80–5.10)
RDW: 12.3 % (ref 11.0–15.0)
Total Lymphocyte: 27.8 %
WBC: 8.2 10*3/uL (ref 3.8–10.8)

## 2024-12-03 LAB — COMPREHENSIVE METABOLIC PANEL WITH GFR
AG Ratio: 2.3 (calc) (ref 1.0–2.5)
ALT: 25 U/L (ref 6–29)
AST: 18 U/L (ref 10–35)
Albumin: 4.6 g/dL (ref 3.6–5.1)
Alkaline phosphatase (APISO): 48 U/L (ref 37–153)
BUN/Creatinine Ratio: 38 (calc) — ABNORMAL HIGH (ref 6–22)
BUN: 27 mg/dL — ABNORMAL HIGH (ref 7–25)
CO2: 28 mmol/L (ref 20–32)
Calcium: 9.8 mg/dL (ref 8.6–10.4)
Chloride: 101 mmol/L (ref 98–110)
Creat: 0.72 mg/dL (ref 0.50–1.03)
Globulin: 2 g/dL (ref 1.9–3.7)
Glucose, Bld: 83 mg/dL (ref 65–99)
Potassium: 4 mmol/L (ref 3.5–5.3)
Sodium: 139 mmol/L (ref 135–146)
Total Bilirubin: 0.4 mg/dL (ref 0.2–1.2)
Total Protein: 6.6 g/dL (ref 6.1–8.1)
eGFR: 96 mL/min/{1.73_m2}

## 2024-12-03 LAB — SEDIMENTATION RATE: Sed Rate: 2 mm/h (ref 0–30)

## 2025-03-04 ENCOUNTER — Ambulatory Visit: Admitting: Internal Medicine
